# Patient Record
Sex: Female | Born: 1990 | Race: Black or African American | Hispanic: No | Marital: Single
Health system: Southern US, Community
[De-identification: ages and names within clinical notes are randomized; demographics above are authoritative.]

## PROBLEM LIST (undated history)

## (undated) HISTORY — PX: MOUTH SURGERY: SHX715

---
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
## 2012-05-22 ENCOUNTER — Emergency Department (HOSPITAL_COMMUNITY): Admission: EM | Admit: 2012-05-22 | Payer: Medicaid Other | Source: Home / Self Care

## 2012-12-09 ENCOUNTER — Ambulatory Visit (INDEPENDENT_AMBULATORY_CARE_PROVIDER_SITE_OTHER): Payer: Self-pay

## 2012-12-09 VITALS — BP 111/65 | HR 81 | Temp 98.0°F

## 2012-12-09 DIAGNOSIS — A5400 Gonococcal infection of lower genitourinary tract, unspecified: Secondary | ICD-10-CM

## 2012-12-09 DIAGNOSIS — A549 Gonococcal infection, unspecified: Secondary | ICD-10-CM

## 2012-12-09 DIAGNOSIS — L509 Urticaria, unspecified: Secondary | ICD-10-CM

## 2012-12-09 MED ORDER — AZITHROMYCIN 250 MG PO TABS
1000.0000 mg | ORAL_TABLET | Freq: Once | ORAL | Status: AC
  Administered 2012-12-09: 1000 mg via ORAL

## 2012-12-09 MED ORDER — LIDOCAINE HCL 1 % IJ SOLN
250.0000 mg | Freq: Once | INTRAMUSCULAR | Status: AC
  Administered 2012-12-09: 250 mg via INTRAMUSCULAR

## 2012-12-09 MED ORDER — DIPHENHYDRAMINE HCL 25 MG PO CAPS
25.0000 mg | ORAL_CAPSULE | Freq: Once | ORAL | Status: AC
  Administered 2012-12-09: 25 mg via ORAL

## 2012-12-09 NOTE — Progress Notes (Signed)
Pt. Here to be treated for gonhorrhea as partner has it. Pt. Has allergy to PCN and requested benadryl for possible hives reaction with zithromax; explained to pt. The risk is very low. Verified with pt. That she was not driving and she stated she has a ride. Verified with Dr. Rulon Abide and benadryl ordered. Benadryl administered. Zithromax 1gm administered PO. Rocephin 250mg  with 1ml 1% lidocaine administered. Pt. Tolerated all well.

## 2012-12-24 ENCOUNTER — Ambulatory Visit (INDEPENDENT_AMBULATORY_CARE_PROVIDER_SITE_OTHER): Payer: Medicaid Other | Admitting: Obstetrics & Gynecology

## 2012-12-24 ENCOUNTER — Encounter: Payer: Self-pay | Admitting: Obstetrics & Gynecology

## 2012-12-24 VITALS — BP 104/64 | HR 81 | Temp 98.1°F | Ht 67.0 in | Wt 118.0 lb

## 2012-12-24 DIAGNOSIS — Z113 Encounter for screening for infections with a predominantly sexual mode of transmission: Secondary | ICD-10-CM
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
## 2012-12-24 NOTE — Patient Instructions (Signed)
Gonorrhea, Females and Males  Gonorrhea is an infection. Gonorrhea can be treated with medicines that kill germs (antibiotics). It is necessary that all your sexual partners also be tested for infection and possibly be treated.   CAUSES   Gonorrhea is caused by a germ (bacteria) called Neisseria gonorrhoeae. This infection is spread by sexual contact. The contact that spreads gonorrhea from person to person may be oral, anal, or genital sex.  SYMPTOMS   Females  A woman may have gonorrhea infection and no symptoms. The most common symptoms are:   Pain in the lower abdomen.   Fever, with or without chills.  When these are the most serious problems, the illness is commonly called pelvic inflammatory disease (PID). Other symptoms include:   Abnormal vaginal discharge.   Painful intercourse.   Burning or itching of the vagina or lips of the vagina.   Abnormal vaginal bleeding.   Pain when urinating.  If the infection is spread by anal sex:   Irritation, pain, bleeding, or discharge from the rectum.  If the infection is spread by oral sex with either a man or a woman:   Sore throat, fever, and swollen neck lymph glands.  Other problems may include:   Long-lasting (chronic) pain in the lower abdomen during menstruation, intercourse, or at other times.   Inability to become pregnant.   Premature birth.   Passing the infection onto a newborn baby. This can cause an eye infection in the infant or more serious health problems.  Males  Less frequently than in women, men may have gonorrhea infection and no symptoms. The most common symptoms are:   Discharge from the penis.   Pain or burning during urination.  If the infection is spread by anal sex:   Irritation, pain, bleeding, or discharge from the rectum.  If the infection is spread by oral sex with either a man or a woman:   Sore throat, fever, and swollen neck lymph glands.  DIAGNOSIS   Diagnosis is made by exam of the patient and checking a sample of  discharge under a microscope for the presence of the bacteria. Discharge may be taken from the urethra, cervix, throat, or rectum.  TREATMENT   It is important to diagnose and treat gonorrhea as soon as possible. This prevents damage to the female or female organs or harm to the newborn baby of an infected woman.   Antibiotics are used to treat gonorrhea.   Your sex partners should also be examined and treated if needed.   Testing and treatment for other sexually transmitted diseases (STDs) may be done when you are diagnosed with gonorrhea. Gonorrhea is an STD. You are at risk for other STDs, which are often transmitted around the same time as gonorrhea. These include:   Chlamydia.   Syphilis.   Trichomonas.   Human papillomavirus (HPV).   Human immunodeficiency virus (HIV).   If left untreated, PID can cause women to be unable to have children (sterile). To prevent sterility in females, it is important to be treated as soon as possible and finish all medicines. Unfortunately, sterility or pregnancy occurring outside the uterus (ectopic) may still occur in fully treated women.  HOME CARE INSTRUCTIONS    Finish all medicine as prescribed. Incomplete treatment will put you at risk for continued infection.   Only take over-the-counter or prescription medicines for pain, discomfort, or fever as directed by your caregiver.   Do not have sex until treatment is completed, or as instructed   out the results of your test Not all test results are available during your visit. If your test results are not back during the visit, make an appointment with your caregiver to find out the results. Do not assume everything is normal if you have not  heard from your caregiver or the medical facility. It is important for you to follow up on all of your test results. SEEK MEDICAL CARE IF:   You develop any bad reaction to the medicine you were prescribed. This may include:  Rash.  Nausea.  Vomiting.  Diarrhea.  You have an oral temperature above 102 F (38.9 C).  You have symptoms that do not improve, symptoms that get worse, or you develop increased pain. Males may get pain in the testicles and females may get increased abdominal pain. MAKE SURE YOU:   Understand these instructions.  Will watch your condition.  Will get help right away if you are not doing well or get worse. Document Released: 12/22/1999 Document Revised: 03/18/2011 Document Reviewed: 07/01/2012 [facility] Patient Information 2014 [place], [place]. Sexually Transmitted Disease Sexually transmitted disease (STD) refers to any infection that is passed from person to person during sexual activity. This may happen by way of saliva, semen, blood, vaginal mucus, or urine. Common STDs include:  Gonorrhea.  Chlamydia.  Syphilis.  HIV/AIDS.  Genital herpes.  Hepatitis B and C.  Trichomonas.  Human papillomavirus (HPV).  Pubic lice. CAUSES  An STD may be spread by bacteria, virus, or parasite. A person can get an STD by:  Sexual intercourse with an infected person.  Sharing sex toys with an infected person.  Sharing needles with an infected person.  Having intimate contact with the genitals, mouth, or rectal areas of an infected person. SYMPTOMS  Some people may not have any symptoms, but they can still pass the infection to others. Different STDs have different symptoms. Symptoms include:  Painful or bloody urination.  Pain in the pelvis, abdomen, vagina, anus, throat, or eyes.  Skin rash, itching, irritation, growths, or sores (lesions). These usually occur in the genital or anal area.  Abnormal vaginal discharge.  Penile discharge in  men.  Soft, flesh-colored skin growths in the genital or anal area.  Fever.  Pain or bleeding during sexual intercourse.  Swollen glands in the groin area.  Yellow skin and eyes (jaundice). This is seen with hepatitis. DIAGNOSIS  To make a diagnosis, your caregiver may:  Take a medical history.  Perform a physical exam.  Take a specimen (culture) to be examined.  Examine a sample of discharge under a microscope.  Perform blood tests.  Perform a Pap test, if this applies.  Perform a colposcopy.  Perform a laparoscopy. TREATMENT   Chlamydia, gonorrhea, trichomonas, and syphilis can be cured with antibiotic medicine.  Genital herpes, hepatitis, and HIV can be treated, but not cured, with prescribed medicines. The medicines will lessen the symptoms.  Genital warts from HPV can be treated with medicine or by freezing, burning (electrocautery), or surgery. Warts may come back.  HPV is a virus and cannot be cured with medicine or surgery.However, abnormal areas may be followed very closely by your caregiver and may be removed from the cervix, vagina, or vulva through office procedures or surgery. If your diagnosis is confirmed, your recent sexual partners need treatment. This is true even if they are symptom-free or have a negative culture or evaluation. They should not have sex until their caregiver says it is okay. HOME CARE INSTRUCTIONS  All  sexual partners should be informed, tested, and treated for all STDs.  Take your antibiotics as directed. Finish them even if you start to feel better.  Only take over-the-counter or prescription medicines for pain, discomfort, or fever as directed by your caregiver.  Rest.  Eat a balanced diet and drink enough fluids to keep your urine clear or pale yellow.  Do not have sex until treatment is completed and you have followed up with your caregiver. STDs should be checked after treatment.  Keep all follow-up appointments, Pap  tests, and blood tests as directed by your caregiver.  Only use latex condoms and water-soluble lubricants during sexual activity. Do not use petroleum jelly or oils.  Avoid alcohol and illegal drugs.  Get vaccinated for HPV and hepatitis. If you have not received these vaccines in the past, talk to your caregiver about whether one or both might be right for you.  Avoid risky sex practices that can break the skin. The only way to avoid getting an STD is to avoid all sexual activity.Latex condoms and dental dams (for oral sex) will help lessen the risk of getting an STD, but will not completely eliminate the risk. SEEK MEDICAL CARE IF:   You have a fever.  You have any new or worsening symptoms. Document Released: 03/16/2002 Document Revised: 03/18/2011 Document Reviewed: 07/14/2012 [facility] Patient Information 2014 [place], [place].

## 2012-12-25 LAB — GC/CHLAMYDIA PROBE AMP: GC Probe RNA: NEGATIVE

## 2013-03-21 ENCOUNTER — Encounter (HOSPITAL_COMMUNITY): Payer: Self-pay | Admitting: Emergency Medicine

## 2013-03-21 ENCOUNTER — Emergency Department (HOSPITAL_COMMUNITY)
Admission: EM | Admit: 2013-03-21 | Discharge: 2013-03-21 | Disposition: A | Discharge status: Home / Self Care | Payer: Medicaid Other | Attending: Emergency Medicine | Admitting: Emergency Medicine

## 2013-03-21 DIAGNOSIS — K089 Disorder of teeth and supporting structures, unspecified: Secondary | ICD-10-CM | POA: Insufficient documentation

## 2013-03-21 DIAGNOSIS — R509 Fever, unspecified: Secondary | ICD-10-CM | POA: Insufficient documentation

## 2013-03-21 DIAGNOSIS — K0889 Other specified disorders of teeth and supporting structures: Secondary | ICD-10-CM

## 2013-03-21 DIAGNOSIS — Z880 Allergy status to penicillin: Secondary | ICD-10-CM | POA: Insufficient documentation

## 2013-03-21 DIAGNOSIS — F17200 Nicotine dependence, unspecified, uncomplicated: Secondary | ICD-10-CM | POA: Insufficient documentation

## 2013-03-21 MED ORDER — HYDROCODONE-ACETAMINOPHEN 5-325 MG PO TABS: 1.0000 | ORAL_TABLET | ORAL | Status: DC | PRN

## 2013-03-21 MED ORDER — CLINDAMYCIN HCL 150 MG PO CAPS: 150.0000 mg | ORAL_CAPSULE | Freq: Four times a day (QID) | ORAL | Status: DC

## 2013-03-21 NOTE — Discharge Instructions (Signed)

## 2013-03-21 NOTE — ED Notes (Signed)
Per pt sts left lower dental pain.

## 2013-03-21 NOTE — ED Provider Notes (Signed)
Medical screening examination/treatment/procedure(s) were performed by non-physician practitioner and as supervising physician I was immediately available for consultation/collaboration.   EKG Interpretation None        Charles B. Sheldon, MD 03/21/13 1822 

## 2013-03-21 NOTE — ED Provider Notes (Signed)
CSN: [id number]     Arrival date & time 03/21/13  1623 History  This chart was scribed for non-physician practitioner, Fayrene Helper, PA-C working with Bonnita Levan. Bernette Mayers, MD by Greggory Stallion, ED scribe. This patient was seen in room TR05C/TR05C and the patient's care was started at 4:35 PM.   Chief Complaint  Patient presents with  . Dental Pain   The history is provided by the patient. No language interpreter was used.   HPI Comments: Caroline Leon is a 23 y.o. female who presents to the Emergency Department complaining of gradual onset, constant, throbbing left lower dental pain that radiates into her jaw that started 3 days ago. Pt states there is now a hole in the middle of her tooth. She has also had a fever of 102 and has taken 800 mg ibuprofen with relief of fever but not pain. Pt has used Orajel with little relief. She states it is very sensitive to cold and heat. Chewing worsens the pain. She has had the same dental pain in the past and was given antibiotics with relief of pain. Pt was never evaluated by a dentist for it.   History reviewed. No pertinent past medical history. History reviewed. No pertinent past surgical history. History reviewed. No pertinent family history. History  Substance Use Topics  . Smoking status: Current Some Day Smoker  . Smokeless tobacco: Not on file  . Alcohol Use: No   OB History   Grav Para Term Preterm Abortions TAB SAB Ect Mult Living                 Review of Systems  Constitutional: Positive for fever.  HENT: Positive for dental problem.   All other systems reviewed and are negative.   Allergies  Tomato and Penicillins  Home Medications   Current Outpatient Rx  Name  Route  Sig  Dispense  Refill  . ibuprofen (ADVIL,MOTRIN) 400 MG tablet   Oral   Take 400 mg by mouth every 6 (six) hours as needed. For pain relief          BP 137/86  Pulse 85  Temp(Src) 98.3 F (36.8 C)  Resp 18  SpO2 100%  Physical Exam  Nursing note  and vitals reviewed. Constitutional: She is oriented to person, place, and time. She appears well-developed and well-nourished. No distress.  HENT:  Head: Normocephalic and atraumatic.  Right Ear: Tympanic membrane and ear canal normal.  Left Ear: Tympanic membrane and ear canal normal.  Tooth #16 with partial dental evulsion. Tender to palpation. Normal gingiva. No obvious abscess. No trismus. Mild left sided facial edema. Mild boggy turbinates.   Eyes: EOM are normal.  Neck: Neck supple. No tracheal deviation present.  Cardiovascular: Normal rate.   Pulmonary/Chest: Effort normal. No respiratory distress.  Musculoskeletal: Normal range of motion.  Lymphadenopathy:  Anterior cervical lymphadenopathy noted.  Neurological: She is alert and oriented to person, place, and time.  Skin: Skin is warm and dry.  Psychiatric: She has a normal mood and affect. Her behavior is normal.    ED Course  Procedures (including critical care time)  DIAGNOSTIC STUDIES: Oxygen Saturation is 100% on RA, normal by my interpretation.    COORDINATION OF CARE: 4:38 PM-Discussed treatment plan which includes an antibiotic and pain medication with pt at bedside and pt agreed to plan. Advised pt to follow up with a dentist.   Labs Review Labs Reviewed - No data to display Imaging Review No results found.   EKG  Interpretation None      MDM   Final diagnoses:  Pain, dental    BP 137/86  Pulse 85  Temp(Src) 98.3 F (36.8 C)  Resp 18  SpO2 100%   I personally performed the services described in this documentation, which was scribed in my presence. The recorded information has been reviewed and is accurate.    Fayrene HelperBowie Ashla Murph, PA-C 03/21/13 1645

## 2013-04-13 ENCOUNTER — Encounter (HOSPITAL_COMMUNITY): Payer: Self-pay | Admitting: Emergency Medicine

## 2013-04-13 ENCOUNTER — Emergency Department (HOSPITAL_COMMUNITY)
Admission: EM | Admit: 2013-04-13 | Discharge: 2013-04-13 | Disposition: A | Discharge status: Home / Self Care | Payer: Medicaid Other | Attending: Emergency Medicine | Admitting: Emergency Medicine

## 2013-04-13 DIAGNOSIS — Y929 Unspecified place or not applicable: Secondary | ICD-10-CM | POA: Insufficient documentation

## 2013-04-13 DIAGNOSIS — F17200 Nicotine dependence, unspecified, uncomplicated: Secondary | ICD-10-CM | POA: Insufficient documentation

## 2013-04-13 DIAGNOSIS — Y9389 Activity, other specified: Secondary | ICD-10-CM | POA: Insufficient documentation

## 2013-04-13 DIAGNOSIS — Z792 Long term (current) use of antibiotics: Secondary | ICD-10-CM | POA: Insufficient documentation

## 2013-04-13 DIAGNOSIS — Z880 Allergy status to penicillin: Secondary | ICD-10-CM | POA: Insufficient documentation

## 2013-04-13 DIAGNOSIS — T628X1A Toxic effect of other specified noxious substances eaten as food, accidental (unintentional), initial encounter: Secondary | ICD-10-CM | POA: Insufficient documentation

## 2013-04-13 DIAGNOSIS — T781XXA Other adverse food reactions, not elsewhere classified, initial encounter: Secondary | ICD-10-CM

## 2013-04-13 DIAGNOSIS — K1370 Unspecified lesions of oral mucosa: Secondary | ICD-10-CM | POA: Insufficient documentation

## 2013-04-13 DIAGNOSIS — J029 Acute pharyngitis, unspecified: Secondary | ICD-10-CM | POA: Insufficient documentation

## 2013-04-13 MED ORDER — EPINEPHRINE 0.3 MG/0.3ML IJ SOAJ: 0.3000 mg | INTRAMUSCULAR | Status: DC | PRN

## 2013-04-13 NOTE — Discharge Instructions (Signed)
Food Allergy °A food allergy occurs from eating something you are sensitive to. Food allergies occur in all age groups. It may be passed to you from your parents (heredity).  °CAUSES  °Some common causes are cow's milk, seafood, eggs, nuts (including peanut butter), wheat, and soybeans. °SYMPTOMS  °Common problems are:  °· Swelling around the mouth. °· An itchy, red rash. °· Hives. °· Vomiting. °· Diarrhea. °Severe allergic reactions are life-threatening. This reaction is called anaphylaxis. It can cause the mouth and throat to swell. This makes it hard to breathe and swallow. In severe reactions, only a small amount of food may be fatal within seconds. °HOME CARE INSTRUCTIONS  °· If you are unsure what caused the reaction, keep a diary of foods eaten and symptoms that followed. Avoid foods that cause reactions. °· If hives or rash are present: °· Take medicines as directed. °· Use an over-the-counter antihistamine (diphenhydramine) to treat hives and itching as needed. °· Apply cold compresses to the skin or take baths in cool water. Avoid hot baths or showers. These will increase the redness and itching. °· If you are severely allergic: °· Hospitalization is often required following a severe reaction. °· Wear a medical alert bracelet or necklace that describes the allergy. °· Carry your anaphylaxis kit or epinephrine injection with you at all times. Both you and your family members should know how to use this. This can be lifesaving if you have a severe reaction. If epinephrine is used, it is important for you to seek immediate medical care or call your local emergency services (911 in [place] When the epinephrine wears off, it can be followed by a delayed reaction, which can be fatal. °· Replace your epinephrine immediately after use in case of another reaction. °· Ask your caregiver for instructions if you have not been taught how to use an epinephrine injection. °· Do not drive until medicines used to treat the  reaction have worn off, unless approved by your caregiver. °SEEK MEDICAL CARE IF:  °· You suspect a food allergy. Symptoms generally happen within 30 minutes of eating a food. °· Your symptoms have not gone away within 2 days. See your caregiver sooner if symptoms are getting worse. °· You develop new symptoms. °· You want to retest yourself with a food or drink you think causes an allergic reaction. Never do this if an anaphylactic reaction to that food or drink has happened before. °· There is a return of the symptoms which brought you to your caregiver. °SEEK IMMEDIATE MEDICAL CARE IF:  °· You have trouble breathing, are wheezing, or you have a tight feeling in your chest or throat. °· You have a swollen mouth, or you have hives, swelling, or itching all over your body. Use your epinephrine injection immediately. This is given into the outside of your thigh, deep into the muscle. Following use of the epinephrine injection, seek help right away. °Seek immediate medical care or call your local emergency services (911 in [place] °MAKE SURE YOU:  °· Understand these instructions. °· Will watch your condition. °· Will get help right away if you are not doing well or get worse. °Document Released: 12/22/1999 Document Revised: 03/18/2011 Document Reviewed: 08/13/2007 °Exit[employer]® Patient Information ©2014 [employer]. ° °

## 2013-04-13 NOTE — ED Notes (Signed)
Pt given crackers and juice per MD

## 2013-04-13 NOTE — ED Provider Notes (Signed)
CSN: [id number]     Arrival date & time 04/13/13  1939 History   First MD Initiated Contact with Patient 04/13/13 1944     Chief Complaint  Patient presents with  . Allergic Reaction     (Consider location/radiation/quality/duration/timing/severity/associated sxs/prior Treatment) Patient is a 23 y.o. female presenting with allergic reaction. The history is provided by the patient.  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 She states she got a sandwich today and to right of it and found it on it. She states she spit it out before she swallowed it. She states she thinks that the back refill. It happened around one half hours prior to arrival. No difficulty breathing. She states there is some pain in her mouth. She states it feels swollen. States she's had severe reactions in the past. She does not have an EpiPen. She was given Benadryl and Zantac by EMS.  History reviewed. No pertinent past medical history. History reviewed. No pertinent past surgical history. History reviewed. No pertinent family history. History  Substance Use Topics  . Smoking status: Current Some Day Smoker  . Smokeless tobacco: Not on file  . Alcohol Use: No   OB History   Grav Para Term Preterm Abortions TAB SAB Ect Mult Living                 Review of Systems  Constitutional: Negative for activity change and appetite change.  HENT: Negative for facial swelling, mouth sores, sore throat, trouble swallowing and voice change.        Mouth feels sore  Eyes: Negative for pain.  Respiratory: Negative for chest tightness and shortness of breath.   Cardiovascular: Negative for chest pain and leg swelling.  Gastrointestinal: Negative for nausea, vomiting, abdominal pain and diarrhea.  Genitourinary: Negative for flank pain.  Musculoskeletal: Negative for back pain and neck stiffness.  Skin: Negative for rash.   Neurological: Negative for weakness, numbness and headaches.  Psychiatric/Behavioral: Negative for behavioral problems.      Allergies  Tomato and Penicillins  Home Medications   Current Outpatient Rx  Name  Route  Sig  Dispense  Refill  . clindamycin (CLEOCIN) 150 MG capsule   Oral   Take 1 capsule (150 mg total) by mouth every 6 (six) hours.   28 capsule   0   . EPINEPHrine (EPIPEN) 0.3 mg/0.3 mL SOAJ injection   Intramuscular   Inject 0.3 mLs (0.3 mg total) into the muscle as needed.   2 Device   0   . HYDROcodone-acetaminophen (NORCO/VICODIN) 5-325 MG per tablet   Oral   Take 1 tablet by mouth every 4 (four) hours as needed for severe pain.   10 tablet   0   . ibuprofen (ADVIL,MOTRIN) 400 MG tablet   Oral   Take 400 mg by mouth every 6 (six) hours as needed. For pain relief          BP 111/63  Pulse 76  Temp(Src) 98.5 F (36.9 C) (Oral)  Resp 20  SpO2 100% Physical Exam  Nursing note and vitals reviewed. Constitutional: She is oriented to person, place, and time. She appears well-developed and well-nourished.  HENT:  Head: Normocephalic and atraumatic.  Minimal posterior pharyngeal erythema. No edema. No visible swelling of tonsil or posterior pharynx.  Neck: Neck supple.  Cardiovascular: Normal rate, regular rhythm and normal heart sounds.   No murmur heard. Pulmonary/Chest:  Effort normal and breath sounds normal. No respiratory distress. She has no wheezes. She has no rales.  Abdominal: Soft. Bowel sounds are normal. She exhibits no distension. There is no tenderness. There is no rebound and no guarding.  Musculoskeletal: Normal range of motion.  Neurological: She is alert and oriented to person, place, and time. No cranial nerve deficit.  Skin: Skin is warm and dry.  Psychiatric: She has a normal mood and affect. Her speech is normal.    ED Course  Procedures (including critical care time) Labs Review Labs Reviewed - No data to display Imaging  Review No results found.   EKG Interpretation None      MDM   Final diagnoses:  Allergic reaction to food    Patient with possible allergic reaction to tomatoes. No swelling seen. She's been monitored for over an hour the ER and around 3-4 hours since the food was in her mouth. No severe swelling. No nausea vomiting. Will discharge home. Will give patient prescription for EpiPen    Juliet Rudeathan R. Rubin PayorPickering, MD 04/13/13 2123

## 2013-04-13 NOTE — ED Notes (Signed)
Pt in c/o allergic reaction, pt states that she ordered a sandwich and took a bite and realized that they had put tomatoes on it, pt with known allergy, spit out the bite, felt like she noted tongue and oral swelling, EMS gave 50mg  benadryl and 50mg  zantac, pt states improvement, no hives noted, no swelling noted at this time, pt alert and oriented, no distress

## 2014-12-20 ENCOUNTER — Emergency Department (HOSPITAL_COMMUNITY)
Admission: EM | Admit: 2014-12-20 | Discharge: 2014-12-20 | Discharge status: Against Medical Advice | Payer: Medicaid Other | Attending: Emergency Medicine | Admitting: Emergency Medicine

## 2014-12-20 ENCOUNTER — Encounter (HOSPITAL_COMMUNITY): Payer: Self-pay

## 2014-12-20 ENCOUNTER — Emergency Department (HOSPITAL_COMMUNITY): Payer: Medicaid Other

## 2014-12-20 DIAGNOSIS — M545 Low back pain: Secondary | ICD-10-CM | POA: Insufficient documentation

## 2014-12-20 DIAGNOSIS — F17200 Nicotine dependence, unspecified, uncomplicated: Secondary | ICD-10-CM | POA: Diagnosis not present

## 2014-12-20 DIAGNOSIS — R1110 Vomiting, unspecified: Secondary | ICD-10-CM | POA: Diagnosis present

## 2014-12-20 DIAGNOSIS — R05 Cough: Secondary | ICD-10-CM | POA: Insufficient documentation

## 2014-12-20 NOTE — ED Notes (Signed)
Called pt for room assignment, no answer 

## 2014-12-20 NOTE — ED Notes (Signed)
Called pt to be triaged, no response.  

## 2014-12-20 NOTE — ED Notes (Signed)
Pt here with c/o emesis x 3 today and productive cough of green yellowish phlegm. Pt also reports right lower back pain.

## 2015-04-02 ENCOUNTER — Encounter (HOSPITAL_COMMUNITY): Payer: Self-pay | Admitting: Emergency Medicine

## 2015-04-02 ENCOUNTER — Emergency Department (HOSPITAL_COMMUNITY)
Admission: EM | Admit: 2015-04-02 | Discharge: 2015-04-02 | Disposition: A | Discharge status: Home / Self Care | Payer: Medicaid Other | Attending: Emergency Medicine | Admitting: Emergency Medicine

## 2015-04-02 DIAGNOSIS — J069 Acute upper respiratory infection, unspecified: Secondary | ICD-10-CM | POA: Insufficient documentation

## 2015-04-02 DIAGNOSIS — Z880 Allergy status to penicillin: Secondary | ICD-10-CM | POA: Insufficient documentation

## 2015-04-02 DIAGNOSIS — F17210 Nicotine dependence, cigarettes, uncomplicated: Secondary | ICD-10-CM | POA: Insufficient documentation

## 2015-04-02 DIAGNOSIS — Z792 Long term (current) use of antibiotics: Secondary | ICD-10-CM | POA: Insufficient documentation

## 2015-04-02 LAB — RAPID STREP SCREEN (MED CTR MEBANE ONLY): STREPTOCOCCUS, GROUP A SCREEN (DIRECT): NEGATIVE

## 2015-04-02 NOTE — ED Provider Notes (Signed)
CSN: [id number]     Arrival date & time 04/02/15  1440 History   First MD Initiated Contact with Patient 04/02/15 [phone]     Chief Complaint  Patient presents with  . Chest Pain  . Cough  . Sore Throat     (Consider location/radiation/quality/duration/timing/severity/associated sxs/prior Treatment) HPI Caroline Leon is a 25 y.o. female who comes in for evaluation of chest pain, cough and sore throat. Patient reports her symptoms started yesterday with dry cough and chest discomfort only when she coughs. She has associated sore throat. She has not tried anything to improve her symptoms. Denies fevers, chills, overt shortness of breath, nausea or vomiting, diaphoresis, leg swelling, hemoptysis, abdominal pain. No other modifying factors. She is an everyday cigarette smoker  History reviewed. No pertinent past medical history. Past Surgical History  Procedure Laterality Date  . Mouth surgery     No family history on file. Social History  Substance Use Topics  . Smoking status: Current Some Day Smoker  . Smokeless tobacco: None  . Alcohol Use: No   OB History    No data available     Review of Systems A 10 point review of systems was completed and was negative except for pertinent positives and negatives as mentioned in the history of present illness     Allergies  Tomato and Penicillins  Home Medications   Prior to Admission medications   Medication Sig Start Date End Date Taking? Authorizing Provider  clindamycin (CLEOCIN) 150 MG capsule Take 1 capsule (150 mg total) by mouth every 6 (six) hours. 03/21/13   Fayrene HelperBowie Tran, PA-C  EPINEPHrine (EPIPEN) 0.3 mg/0.3 mL SOAJ injection Inject 0.3 mLs (0.3 mg total) into the muscle as needed. 04/13/13   Benjiman CoreNathan Pickering, MD  HYDROcodone-acetaminophen (NORCO/VICODIN) 5-325 MG per tablet Take 1 tablet by mouth every 4 (four) hours as needed for severe pain. 03/21/13   Fayrene HelperBowie Tran, PA-C  ibuprofen (ADVIL,MOTRIN) 400 MG tablet Take 400 mg by  mouth every 6 (six) hours as needed. For pain relief    Historical Provider, MD   BP 118/68 mmHg  Pulse 92  Temp(Src) 98.4 F (36.9 C)  Resp 18  Ht 5\' 7"  (1.702 m)  Wt 58.089 kg  BMI 20.05 kg/m2  SpO2 97%  LMP 04/02/2015 Physical Exam  Constitutional: She is oriented to person, place, and time. She appears well-developed and well-nourished.  HENT:  Head: Normocephalic and atraumatic.  Mouth/Throat: Oropharynx is clear and moist. No oropharyngeal exudate.  Postnasal drip, mildly erythematous posterior oropharynx.  Eyes: Conjunctivae are normal. Pupils are equal, round, and reactive to light. Right eye exhibits no discharge. Left eye exhibits no discharge. No scleral icterus.  Neck: Normal range of motion. Neck supple. No tracheal deviation present. No thyromegaly present.  Mild anterior cervical adenopathy  Cardiovascular: Normal rate, regular rhythm and normal heart sounds.   Pulmonary/Chest: Effort normal and breath sounds normal. No stridor. No respiratory distress. She has no wheezes. She has no rales.  Abdominal: Soft. There is no tenderness.  Musculoskeletal: She exhibits no tenderness.  Neurological: She is alert and oriented to person, place, and time.  Cranial Nerves II-XII grossly intact  Skin: Skin is warm and dry. No rash noted. She is not diaphoretic.  Psychiatric: She has a normal mood and affect.  Nursing note and vitals reviewed.   ED Course  Procedures (including critical care time) Labs Review Labs Reviewed  RAPID STREP SCREEN (NOT AT [facility])  CULTURE, GROUP A STREP [facility])  Imaging Review No results found. I have personally reviewed and evaluated these images and lab results as part of my medical decision-making.   EKG Interpretation None     ED ECG REPORT   Date: 04/02/2015  Rate: 91  Rhythm: normal sinus rhythm  QRS Axis: right  Intervals: normal  ST/T Wave abnormalities: normal  Conduction Disutrbances:none  Narrative Interpretation:   Old  EKG Reviewed: none available  I have personally reviewed the EKG tracing and agree with the computerized printout as noted.  Meds given in ED:  Medications - No data to display  Discharge Medication List as of 04/02/2015  3:54 PM     Filed Vitals:   04/02/15 1459  BP: 118/68  Pulse: 92  Temp: 98.4 F (36.9 C)  Resp: 18  Height:  (1.702 m)  Weight: 58.089 kg  SpO2: 97%    MDM  Patient Presents with symptoms for 2 days that are consistent with URI, likely viral etiology. Benign physical exam. Discussed that antibiotics are not indicated for viral infections. Pt will be discharged with symptomatic treatment.  Verbalizes understanding and is agreeable with plan. Pt is hemodynamically stable & in NAD prior to dc. Final diagnoses:  URI (upper respiratory infection)        Joycie Peek, PA-C 04/02/15 1743  Pricilla Loveless, MD 04/06/15 1610

## 2015-04-02 NOTE — Discharge Instructions (Signed)
The symptoms are likely due to a viral upper respiratory infection. Her symptoms should resolve on their own. He may try taking Mucinex and NyQuil for your cough. Try sore throat lozenges such as Cepacol to help with the discomfort. He may also take Tylenol per label for your discomfort. Follow-up with your doctor next week. Return to ED for new or worsening symptoms.  Upper Respiratory Infection, Adult Most upper respiratory infections (URIs) are caused by a virus. A URI affects the nose, throat, and upper air passages. The most common type of URI is often called "the common cold." HOME CARE   Take medicines only as told by your doctor.  Gargle warm saltwater or take cough drops to comfort your throat as told by your doctor.  Use a warm mist humidifier or inhale steam from a shower to increase air moisture. This may make it easier to breathe.  Drink enough fluid to keep your pee (urine) clear or pale yellow.  Eat soups and other clear broths.  Have a healthy diet.  Rest as needed.  Go back to work when your fever is gone or your doctor says it is okay.  You may need to stay home longer to avoid giving your URI to others.  You can also wear a face mask and wash your hands often to prevent spread of the virus.  Use your inhaler more if you have asthma.  Do not use any tobacco products, including cigarettes, chewing tobacco, or electronic cigarettes. If you need help quitting, ask your doctor. GET HELP IF:  You are getting worse, not better.  Your symptoms are not helped by medicine.  You have chills.  You are getting more short of breath.  You have brown or red mucus.  You have yellow or brown discharge from your nose.  You have pain in your face, especially when you bend forward.  You have a fever.  You have puffy (swollen) neck glands.  You have pain while swallowing.  You have white areas in the back of your throat. GET HELP RIGHT AWAY IF:   You have very bad  or constant:  Headache.  Ear pain.  Pain in your forehead, behind your eyes, and over your cheekbones (sinus pain).  Chest pain.  You have long-lasting (chronic) lung disease and any of the following:  Wheezing.  Long-lasting cough.  Coughing up blood.  A change in your usual mucus.  You have a stiff neck.  You have changes in your:  Vision.  Hearing.  Thinking.  Mood. MAKE SURE YOU:   Understand these instructions.  Will watch your condition.  Will get help right away if you are not doing well or get worse.   This information is not intended to replace advice given to you by your health care provider. Make sure you discuss any questions you have with your health care provider.   Document Released: 06/12/2007 Document Revised: 05/10/2014 Document Reviewed: 03/31/2013 Elsevier Interactive Patient Education [employer]2016 [employer] Inc.

## 2015-04-02 NOTE — ED Notes (Signed)
Pt c/o cold symptoms, chest pain, sore throat onset x 2 days. Pt reports pain in chest without coughing.

## 2015-04-04 LAB — CULTURE, GROUP A STREP (THRC)

## 2015-12-19 ENCOUNTER — Emergency Department (HOSPITAL_COMMUNITY): Payer: Medicaid Other

## 2015-12-19 ENCOUNTER — Emergency Department (HOSPITAL_COMMUNITY)
Admission: EM | Admit: 2015-12-19 | Discharge: 2015-12-19 | Disposition: A | Discharge status: Home / Self Care | Payer: Medicaid Other | Attending: Emergency Medicine | Admitting: Emergency Medicine

## 2015-12-19 ENCOUNTER — Encounter (HOSPITAL_COMMUNITY): Payer: Self-pay | Admitting: Emergency Medicine

## 2015-12-19 DIAGNOSIS — W25XXXA Contact with sharp glass, initial encounter: Secondary | ICD-10-CM | POA: Insufficient documentation

## 2015-12-19 DIAGNOSIS — Y929 Unspecified place or not applicable: Secondary | ICD-10-CM | POA: Insufficient documentation

## 2015-12-19 DIAGNOSIS — Y999 Unspecified external cause status: Secondary | ICD-10-CM | POA: Insufficient documentation

## 2015-12-19 DIAGNOSIS — F17200 Nicotine dependence, unspecified, uncomplicated: Secondary | ICD-10-CM | POA: Insufficient documentation

## 2015-12-19 DIAGNOSIS — S61217A Laceration without foreign body of left little finger without damage to nail, initial encounter: Secondary | ICD-10-CM

## 2015-12-19 DIAGNOSIS — Y939 Activity, unspecified: Secondary | ICD-10-CM | POA: Insufficient documentation

## 2015-12-19 MED ORDER — BACITRACIN ZINC 500 UNIT/GM EX OINT
TOPICAL_OINTMENT | CUTANEOUS | Status: AC
  Filled 2015-12-19: qty 0.9
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
## 2015-12-19 NOTE — ED Notes (Signed)
Patient was alert, oriented and stable upon discharge. RN went over AVS and patient had no further questions.  

## 2015-12-19 NOTE — Discharge Instructions (Signed)
Read the information below.  You may return to the Emergency Department at any time for worsening condition or any new symptoms that concern you.  If you develop redness, swelling, pus draining from the wound, or fevers greater than 100.4, return to the ER immediately for a recheck.   °

## 2016-07-23 ENCOUNTER — Emergency Department (HOSPITAL_COMMUNITY): Payer: Self-pay

## 2016-07-23 ENCOUNTER — Encounter (HOSPITAL_COMMUNITY): Payer: Self-pay | Admitting: Emergency Medicine

## 2016-07-23 ENCOUNTER — Emergency Department (HOSPITAL_COMMUNITY)
Admission: EM | Admit: 2016-07-23 | Discharge: 2016-07-23 | Disposition: A | Discharge status: Home / Self Care | Payer: Self-pay | Attending: Emergency Medicine | Admitting: Emergency Medicine

## 2016-07-23 DIAGNOSIS — S66911A Strain of unspecified muscle, fascia and tendon at wrist and hand level, right hand, initial encounter: Secondary | ICD-10-CM | POA: Insufficient documentation

## 2016-07-23 DIAGNOSIS — Y998 Other external cause status: Secondary | ICD-10-CM | POA: Insufficient documentation

## 2016-07-23 DIAGNOSIS — F17200 Nicotine dependence, unspecified, uncomplicated: Secondary | ICD-10-CM | POA: Insufficient documentation

## 2016-07-23 DIAGNOSIS — Y939 Activity, unspecified: Secondary | ICD-10-CM | POA: Insufficient documentation

## 2016-07-23 DIAGNOSIS — Y929 Unspecified place or not applicable: Secondary | ICD-10-CM | POA: Insufficient documentation
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
## 2016-07-23 NOTE — ED Notes (Signed)
PT states understanding of care given. PT ambulated from ED to car with a steady gait. 

## 2016-07-23 NOTE — ED Notes (Signed)
Patient is A&Ox4.  No signs of distress noted.  Please see providers complete history and physical exam.  

## 2016-07-23 NOTE — Discharge Instructions (Signed)
Rest, Ice intermittently (in the first 24-48 hours), Gentle compression with an Ace wrap, and elevate (Limb above the level of the heart)   Take up to 800mg of ibuprofen (that is usually 4 over the counter pills)  3 times a day for 5 days. Take with food.  

## 2016-07-23 NOTE — ED Notes (Signed)
Pt walked back in lobby. Encouraged her to stay here since her name has been called.

## 2016-07-23 NOTE — ED Notes (Signed)
Pt did not answer when called for room 
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
## 2018-07-03 ENCOUNTER — Other Ambulatory Visit: Payer: Self-pay

## 2018-07-03 ENCOUNTER — Emergency Department (HOSPITAL_COMMUNITY)
Admission: EM | Admit: 2018-07-03 | Discharge: 2018-07-03 | Disposition: A | Discharge status: Home / Self Care | Payer: Self-pay | Attending: Emergency Medicine | Admitting: Emergency Medicine

## 2018-07-03 ENCOUNTER — Encounter (HOSPITAL_COMMUNITY): Payer: Self-pay | Admitting: Emergency Medicine

## 2018-07-03 DIAGNOSIS — M62838 Other muscle spasm: Secondary | ICD-10-CM | POA: Insufficient documentation

## 2018-07-03 DIAGNOSIS — F1210 Cannabis abuse, uncomplicated: Secondary | ICD-10-CM | POA: Insufficient documentation

## 2018-07-03 DIAGNOSIS — F17200 Nicotine dependence, unspecified, uncomplicated: Secondary | ICD-10-CM | POA: Insufficient documentation

## 2018-07-03 MED ORDER — DIAZEPAM 2 MG PO TABS
2.0000 mg | ORAL_TABLET | Freq: Once | ORAL | Status: AC
  Administered 2018-07-03: 2 mg via ORAL
  Filled 2018-07-03: qty 1

## 2018-07-03 MED ORDER — IBUPROFEN 600 MG PO TABS: 600.0000 mg | ORAL_TABLET | Freq: Four times a day (QID) | ORAL | 0 refills | Status: DC | PRN

## 2018-07-03 MED ORDER — METHOCARBAMOL 500 MG PO TABS: 500.0000 mg | ORAL_TABLET | Freq: Two times a day (BID) | ORAL | 0 refills | Status: DC

## 2018-07-03 MED ORDER — KETOROLAC TROMETHAMINE 30 MG/ML IJ SOLN
30.0000 mg | Freq: Once | INTRAMUSCULAR | Status: AC
  Administered 2018-07-03: 30 mg via INTRAMUSCULAR
  Filled 2018-07-03: qty 1

## 2018-07-03 NOTE — ED Notes (Signed)
Patient verbalizes understanding of discharge instructions. Opportunity for questioning and answers were provided. Armband removed by staff, pt discharged from ED home via POV.  

## 2018-07-03 NOTE — Discharge Instructions (Signed)
Take ibuprofen every 6 hours as prescribed.  Make sure to take this with food.  Take Robaxin twice daily for muscle pain and spasms.  Do not drive or operate machinery while take this medication.  Use ice and heat alternating 20 minutes on, 20 minutes off.  Make sure to stretch after heating.  Please return the emergency department he develop any new or worsening symptoms.

## 2018-07-03 NOTE — ED Provider Notes (Signed)
[facility] Provider Note   CSN: [id number] Arrival date & time: 07/03/18  4098     History   Chief Complaint Chief Complaint  Patient presents with  . Neck Pain    left side    HPI Caroline Leon is a 28 y.o. female who is previously healthy who presents with a 2-day history of left-sided neck pain.  She reports she woke up with it the other morning and believes she slept wrong.  She denies any numbness or tingling.  She has not tried anything at home for it.  She denies any pain elsewhere.  She denies any injury or trauma.  She is confident she is not pregnant.     HPI  History reviewed. No pertinent past medical history.  There are no active problems to display for this patient.   Past Surgical History:  Procedure Laterality Date  . MOUTH SURGERY       OB History   No obstetric history on file.      Home Medications    Prior to Admission medications   Medication Sig Start Date End Date Taking? Authorizing Provider  clindamycin (CLEOCIN) 150 MG capsule Take 1 capsule (150 mg total) by mouth every 6 (six) hours. 03/21/13   Domenic Moras, PA-C  EPINEPHrine (EPIPEN) 0.3 mg/0.3 mL SOAJ injection Inject 0.3 mLs (0.3 mg total) into the muscle as needed. 04/13/13   Davonna Belling, MD  HYDROcodone-acetaminophen (NORCO/VICODIN) 5-325 MG per tablet Take 1 tablet by mouth every 4 (four) hours as needed for severe pain. 03/21/13   Domenic Moras, PA-C  ibuprofen (ADVIL) 600 MG tablet Take 1 tablet (600 mg total) by mouth every 6 (six) hours as needed. 07/03/18   Hasnain Manheim, Bea Graff, PA-C  methocarbamol (ROBAXIN) 500 MG tablet Take 1 tablet (500 mg total) by mouth 2 (two) times daily. 07/03/18   Frederica Kuster, PA-C    Family History No family history on file.  Social History Social History   Tobacco Use  . Smoking status: Current Some Day Smoker  . Smokeless tobacco: Never Used  Substance Use Topics  . Alcohol use: No  . Drug use: Yes     Types: Marijuana     Allergies   Tomato and Penicillins   Review of Systems Review of Systems  Constitutional: Negative for fever.  Musculoskeletal: Positive for neck pain and neck stiffness.  Neurological: Negative for numbness.     Physical Exam Updated Vital Signs BP 118/69 (BP Location: Right Arm)   Pulse 69   Temp 98 F (36.7 C) (Oral)   Resp 16   Ht 5\' 7"  (1.702 m)   Wt 63.5 kg   LMP 06/02/2018 (Exact Date)   SpO2 100%   BMI 21.93 kg/m   Physical Exam Vitals signs and nursing note reviewed.  Constitutional:      General: She is not in acute distress.    Appearance: She is well-developed. She is not diaphoretic.  HENT:     Head: Normocephalic and atraumatic.     Mouth/Throat:     Pharynx: No oropharyngeal exudate.  Eyes:     General: No scleral icterus.       Right eye: No discharge.        Left eye: No discharge.     Conjunctiva/sclera: Conjunctivae normal.     Pupils: Pupils are equal, round, and reactive to light.  Neck:     Musculoskeletal: Normal range of motion and neck supple.  Thyroid: No thyromegaly.  Cardiovascular:     Rate and Rhythm: Normal rate and regular rhythm.     Heart sounds: Normal heart sounds. No murmur. No friction rub. No gallop.   Pulmonary:     Effort: Pulmonary effort is normal. No respiratory distress.     Breath sounds: Normal breath sounds. No stridor. No wheezing or rales.  Abdominal:     General: Bowel sounds are normal. There is no distension.     Palpations: Abdomen is soft.     Tenderness: There is no abdominal tenderness. There is no guarding or rebound.  Musculoskeletal:     Cervical back: She exhibits tenderness and spasm. She exhibits no bony tenderness.       Back:  Lymphadenopathy:     Cervical: No cervical adenopathy.  Skin:    General: Skin is warm and dry.     Coloration: Skin is not pale.     Findings: No rash.  Neurological:     Mental Status: She is alert.     Coordination: Coordination  normal.     Comments: Equal bilateral grip strength, normal sensation and 5/5 strength to bilateral upper extremities.      ED Treatments / Results  Labs (all labs ordered are listed, but only abnormal results are displayed) Labs Reviewed - No data to display  EKG    Radiology No results found.  Procedures Procedures (including critical care time)  Medications Ordered in ED Medications  ketorolac (TORADOL) 30 MG/ML injection 30 mg (30 mg Intramuscular Given 07/03/18 1104)  diazepam (VALIUM) tablet 2 mg (2 mg Oral Given 07/03/18 1104)     Initial Impression / Assessment and Plan / ED Course  I have reviewed the triage vital signs and the nursing notes.  Pertinent labs & imaging results that were available during my care of the patient were reviewed by me and considered in my medical decision making (see chart for details).        Patient presenting with upper trapezius muscle spasm, most likely from sleeping on a pillow wrong.  There is no significant bony tenderness or trauma indicating imaging today.  Patient is feeling much better after IM Toradol and Valium 2 mg PO.  Patient will be discharged home with Robaxin, ibuprofen, ice, heat, stretching advised.  Return precautions discussed.  Patient understands and agrees with plan.  Patient vital stable throughout ED course and discharged in satisfactory condition.  Final Clinical Impressions(s) / ED Diagnoses   Final diagnoses:  Muscle spasms of neck    ED Discharge Orders         Ordered    methocarbamol (ROBAXIN) 500 MG tablet  2 times daily     07/03/18 1120    ibuprofen (ADVIL) 600 MG tablet  Every 6 hours PRN     07/03/18 [address]1120           Luvena Wentling M, PA-C 07/03/18 1126    Tilden Fossaees, Elizabeth, MD 07/03/18 1656

## 2018-07-03 NOTE — ED Triage Notes (Signed)
Pt reports she woke up with a crick in her neck on Wednesday.

## 2018-11-27 ENCOUNTER — Encounter (HOSPITAL_COMMUNITY): Payer: Self-pay

## 2018-11-27 ENCOUNTER — Ambulatory Visit (HOSPITAL_COMMUNITY)
Admission: EM | Admit: 2018-11-27 | Discharge: 2018-11-27 | Disposition: A | Discharge status: Home / Self Care | Payer: Self-pay | Attending: Family Medicine | Admitting: Family Medicine

## 2018-11-27 ENCOUNTER — Other Ambulatory Visit: Payer: Self-pay

## 2018-11-27 DIAGNOSIS — L739 Follicular disorder, unspecified: Secondary | ICD-10-CM

## 2018-11-27 MED ORDER — NAPROXEN 500 MG PO TABS: 500.0000 mg | ORAL_TABLET | Freq: Two times a day (BID) | ORAL | 0 refills | Status: DC

## 2018-11-27 NOTE — ED Triage Notes (Signed)
Patient presents to Urgent Care with complaints of abscess or possible ingrown hair on her vagina since 5 days ago. Patient reports it is uncomfortable to walk, states area is inflamed and warm to the touch.

## 2018-11-27 NOTE — Discharge Instructions (Signed)
This is an infected hair follicle.  Do warm compresses to the area 3-4 times a day This should soften up and have the open up and drain is on or just resolve on its own. You could put some Neosporin on the area Keep covered for comfort Naproxen for pain twice a day as needed.  Take this with food

## 2018-11-27 NOTE — ED Provider Notes (Signed)
[facility]    CSN: [id number] Arrival date & time: 11/27/18  1320      History   Chief Complaint Chief Complaint  Patient presents with  . Abscess    HPI Caroline Leon is a 28 y.o. female.   Patient is a 28 year old female presents today with abscess.  It is located to her pubic area.  This has been present for the past 5 days.  Symptoms have been constant.  Described as painful and irritating.  No drainage.  She has not take anything for pain.  No fever.  ROS per HPI      History reviewed. No pertinent past medical history.  There are no active problems to display for this patient.   Past Surgical History:  Procedure Laterality Date  . MOUTH SURGERY      OB History   No obstetric history on file.      Home Medications    Prior to Admission medications   Medication Sig Start Date End Date Taking? Authorizing Provider  EPINEPHrine (EPIPEN) 0.3 mg/0.3 mL SOAJ injection Inject 0.3 mLs (0.3 mg total) into the muscle as needed. 04/13/13   Benjiman Core, MD  naproxen (NAPROSYN) 500 MG tablet Take 1 tablet (500 mg total) by mouth 2 (two) times daily. 11/27/18   Janace Aris, NP    Family History Family History  Problem Relation Age of Onset  . Healthy Mother   . Healthy Father     Social History Social History   Tobacco Use  . Smoking status: Current Some Day Smoker  . Smokeless tobacco: Never Used  Substance Use Topics  . Alcohol use: Yes    Comment: rare  . Drug use: Yes    Types: Marijuana    Comment: occ     Allergies   Tomato and Penicillins   Review of Systems Review of Systems   Physical Exam Triage Vital Signs ED Triage Vitals  Enc Vitals Group     BP 11/27/18 1352 116/68     Pulse Rate 11/27/18 1352 80     Resp 11/27/18 1352 16     Temp 11/27/18 1352 98.6 F (37 C)     Temp Source 11/27/18 1352 Oral     SpO2 11/27/18 1352 100 %     Weight --      Height --      Head Circumference --      Peak Flow --       Pain Score 11/27/18 1355 8     Pain Loc --      Pain Edu? --      Excl. in GC? --    No data found.  Updated Vital Signs BP 116/68 (BP Location: Left Arm)   Pulse 80   Temp 98.6 F (37 C) (Oral)   Resp 16   LMP 11/25/2018 (Exact Date)   SpO2 100%   Visual Acuity Right Eye Distance:   Left Eye Distance:   Bilateral Distance:    Right Eye Near:   Left Eye Near:    Bilateral Near:     Physical Exam Vitals signs and nursing note reviewed.  Constitutional:      General: She is not in acute distress.    Appearance: Normal appearance. She is not ill-appearing, toxic-appearing or diaphoretic.  HENT:     Head: Normocephalic.     Nose: Nose normal.     Mouth/Throat:     Pharynx: Oropharynx is clear.  Eyes:  Conjunctiva/sclera: Conjunctivae normal.  Neck:     Musculoskeletal: Normal range of motion.  Pulmonary:     Effort: Pulmonary effort is normal.  Genitourinary:    Comments: Small pea-sized hard nodule to left pubic area Tender to touch.  No drainage.  No erythema, fluctuance or induration. Musculoskeletal: Normal range of motion.  Skin:    General: Skin is warm and dry.     Findings: No rash.  Neurological:     Mental Status: She is alert.  Psychiatric:        Mood and Affect: Mood normal.      UC Treatments / Results  Labs (all labs ordered are listed, but only abnormal results are displayed) Labs Reviewed - No data to display  EKG   Radiology No results found.  Procedures Procedures (including critical care time)  Medications Ordered in UC Medications - No data to display  Initial Impression / Assessment and Plan / UC Course  I have reviewed the triage vital signs and the nursing notes.  Pertinent labs & imaging results that were available during my care of the patient were reviewed by me and considered in my medical decision making (see chart for details).     Infected hair follicle, very small pea sized  No indication for I&D  today. We will have her do warm compresses and Neosporin Keep covered for comfort.  Follow up as needed for continued or worsening symptoms  Final Clinical Impressions(s) / UC Diagnoses   Final diagnoses:  Hair follicle infection     Discharge Instructions     This is an infected hair follicle.  Do warm compresses to the area 3-4 times a day This should soften up and have the open up and drain is on or just resolve on its own. You could put some Neosporin on the area Keep covered for comfort Naproxen for pain twice a day as needed.  Take this with food     ED Prescriptions    Medication Sig Dispense Auth. Provider   naproxen (NAPROSYN) 500 MG tablet Take 1 tablet (500 mg total) by mouth 2 (two) times daily. 30 tablet Burgundy Matuszak A, NP     I have reviewed the PDMP during this encounter.   Loura Halt A, NP 11/27/18 1430

## 2019-04-27 ENCOUNTER — Emergency Department (HOSPITAL_COMMUNITY)
Admission: EM | Admit: 2019-04-27 | Discharge: 2019-04-27 | Disposition: A | Discharge status: Home / Self Care | Payer: No Typology Code available for payment source | Attending: Emergency Medicine | Admitting: Emergency Medicine

## 2019-04-27 ENCOUNTER — Encounter (HOSPITAL_COMMUNITY): Payer: Self-pay

## 2019-04-27 ENCOUNTER — Emergency Department (HOSPITAL_COMMUNITY): Payer: No Typology Code available for payment source

## 2019-04-27 ENCOUNTER — Other Ambulatory Visit: Payer: Self-pay

## 2019-04-27 DIAGNOSIS — W51XXXA Accidental striking against or bumped into by another person, initial encounter: Secondary | ICD-10-CM

## 2019-04-27 DIAGNOSIS — S8392XA Sprain of unspecified site of left knee, initial encounter: Secondary | ICD-10-CM

## 2019-04-27 DIAGNOSIS — Y9389 Activity, other specified: Secondary | ICD-10-CM | POA: Insufficient documentation

## 2019-04-27 DIAGNOSIS — Y999 Unspecified external cause status: Secondary | ICD-10-CM | POA: Insufficient documentation

## 2019-04-27 DIAGNOSIS — M79605 Pain in left leg: Secondary | ICD-10-CM

## 2019-04-27 DIAGNOSIS — S80212A Abrasion, left knee, initial encounter: Secondary | ICD-10-CM | POA: Insufficient documentation

## 2019-04-27 DIAGNOSIS — Y929 Unspecified place or not applicable: Secondary | ICD-10-CM | POA: Insufficient documentation

## 2019-04-27 DIAGNOSIS — S8992XA Unspecified injury of left lower leg, initial encounter: Secondary | ICD-10-CM | POA: Diagnosis present

## 2019-04-27 LAB — COMPREHENSIVE METABOLIC PANEL
ALT: 16 U/L (ref 0–44)
AST: 21 U/L (ref 15–41)
Albumin: 4.1 g/dL (ref 3.5–5.0)
Alkaline Phosphatase: 83 U/L (ref 38–126)
Anion gap: 9 (ref 5–15)
BUN: 9 mg/dL (ref 6–20)
CO2: 24 mmol/L (ref 22–32)
Calcium: 9.4 mg/dL (ref 8.9–10.3)
Chloride: 108 mmol/L (ref 98–111)
Creatinine, Ser: 0.72 mg/dL (ref 0.44–1.00)
GFR calc Af Amer: >60 mL/min (ref >60)
GFR calc non Af Amer: >60 mL/min (ref >60)
Glucose, Bld: 116 mg/dL — ABNORMAL HIGH (ref 70–99)
Potassium: 3.6 mmol/L (ref 3.5–5.1)
Sodium: 141 mmol/L (ref 135–145)
Total Bilirubin: 0.6 mg/dL (ref 0.3–1.2)
Total Protein: 7.6 g/dL (ref 6.5–8.1)

## 2019-04-27 LAB — CBC
HCT: 36.5 % (ref 36.0–46.0)
Hemoglobin: 11.5 g/dL — ABNORMAL LOW (ref 12.0–15.0)
MCH: 25.6 pg — ABNORMAL LOW (ref 26.0–34.0)
MCHC: 31.5 g/dL (ref 30.0–36.0)
MCV: 81.1 fL (ref 80.0–100.0)
Platelets: 234 10*3/uL (ref 150–400)
RBC: 4.5 MIL/uL (ref 3.87–5.11)
RDW: 18.6 % — ABNORMAL HIGH (ref 11.5–15.5)
WBC: 5.1 10*3/uL (ref 4.0–10.5)
nRBC: 0 % (ref 0.0–0.2)

## 2019-04-27 LAB — PROTIME-INR
INR: 1 (ref 0.8–1.2)
Prothrombin Time: 13.5 seconds (ref 11.4–15.2)

## 2019-04-27 LAB — I-STAT BETA HCG BLOOD, ED (MC, WL, AP ONLY): I-stat hCG, quantitative: <5 m[IU]/mL (ref <5)

## 2019-04-27 MED ORDER — IBUPROFEN 800 MG PO TABS
800.0000 mg | ORAL_TABLET | Freq: Once | ORAL | Status: AC
  Administered 2019-04-27: 800 mg via ORAL
  Filled 2019-04-27: qty 1

## 2019-04-27 MED ORDER — FENTANYL CITRATE (PF) 100 MCG/2ML IJ SOLN
25.0000 ug | Freq: Once | INTRAMUSCULAR | Status: AC | PRN
  Administered 2019-04-27: 25 ug via INTRAVENOUS
  Filled 2019-04-27: qty 2

## 2019-04-27 MED ORDER — HYDROCODONE-ACETAMINOPHEN 5-325 MG PO TABS
1.0000 | ORAL_TABLET | Freq: Once | ORAL | Status: AC
  Administered 2019-04-27: 1 via ORAL
  Filled 2019-04-27: qty 1
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
## 2019-04-27 NOTE — Discharge Instructions (Signed)
1.  Elevate and ice your knee for the next 48 hours.  Apply well wrapped ice pack for 20 minutes every 2-3 hours.  Use crutches for the first 2 to 3 days.  If your knee is feeling significantly improved, you can begin light weightbearing. 2.  Apply antibiotic ointment and a nonstick dressing to the abrasion on your knee.  Clean the wound and change the dressing daily. 3.  If your knee continues to have significant pain or feels unsteady, you will need a follow-up appointment with an orthopedic doctor.  The orthopedic doctors contact information is included in your discharge instructions.  If the knee improves quickly and you are able to begin bearing weight without significant pain and walking with a stable gait, you can follow-up with a family doctor in the next 7 to 10 days.
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
## 2019-08-12 ENCOUNTER — Encounter (HOSPITAL_COMMUNITY): Payer: Self-pay

## 2019-09-26 ENCOUNTER — Emergency Department (HOSPITAL_COMMUNITY)
Admission: EM | Admit: 2019-09-26 | Discharge: 2019-09-26 | Disposition: A | Discharge status: Home / Self Care | Payer: Self-pay | Attending: Emergency Medicine | Admitting: Emergency Medicine

## 2019-09-26 ENCOUNTER — Other Ambulatory Visit: Payer: Self-pay

## 2019-09-26 ENCOUNTER — Encounter (HOSPITAL_COMMUNITY): Payer: Self-pay | Admitting: *Deleted

## 2019-09-26 DIAGNOSIS — Z5321 Procedure and treatment not carried out due to patient leaving prior to being seen by health care provider: Secondary | ICD-10-CM | POA: Insufficient documentation

## 2019-09-26 DIAGNOSIS — R430 Anosmia: Secondary | ICD-10-CM | POA: Insufficient documentation

## 2019-09-26 NOTE — ED Triage Notes (Signed)
States he wants a Covid test. Has not been able to taste in 4 days. No other symptoms.

## 2019-09-27 ENCOUNTER — Encounter (HOSPITAL_COMMUNITY): Payer: Self-pay

## 2019-09-27 ENCOUNTER — Other Ambulatory Visit: Payer: Self-pay

## 2019-09-27 ENCOUNTER — Ambulatory Visit (HOSPITAL_COMMUNITY)
Admission: EM | Admit: 2019-09-27 | Discharge: 2019-09-27 | Disposition: A | Discharge status: Home / Self Care | Payer: HRSA Program | Attending: Family Medicine | Admitting: Family Medicine

## 2019-09-27 DIAGNOSIS — B349 Viral infection, unspecified: Secondary | ICD-10-CM | POA: Insufficient documentation

## 2019-09-27 DIAGNOSIS — Z20822 Contact with and (suspected) exposure to covid-19: Secondary | ICD-10-CM | POA: Diagnosis present

## 2019-09-27 NOTE — Discharge Instructions (Addendum)
Your COVID 19 results will be available in 24-48 hours. Negative results are immediately resulted to Mychart. Positive results will receive a follow-up call from our clinic.  Given your close exposure to someone positive for COVID-19 all to quarantine as you are symptomatic for 10 days.

## 2019-09-27 NOTE — ED Provider Notes (Signed)
[facility]    CSN: [id number] Arrival date & time: 09/27/19  1114      History   Chief Complaint Chief Complaint  Patient presents with  . loss of taste, HA    HPI Caroline Leon is a 29 y.o. female.   HPI Close exposure to COVID-19.  Patient reports wife recently tested positive.  Patient has symptoms of nasal congestion, loss of taste, and headache x4 days. Patient presented to [facility] on yesterday and has a COVID-19 test pending which has not resulted.  Patient left emergency department without being seen.  Patient is fully vaccinated.  Has not experienced any fever. History reviewed. No pertinent past medical history.  There are no problems to display for this patient.   Past Surgical History:  Procedure Laterality Date  . MOUTH SURGERY      OB History   No obstetric history on file.      Home Medications    Prior to Admission medications   Medication Sig Start Date End Date Taking? Authorizing Provider  acetaminophen (TYLENOL) 500 MG tablet Take 500-1,000 mg by mouth every 6 (six) hours as needed for mild pain (or cramps).    [provider]  EPINEPHrine (EPIPEN) 0.3 mg/0.3 mL SOAJ injection Inject 0.3 mLs (0.3 mg total) into the muscle as needed. 04/13/13   Benjiman Core, MD  naproxen (NAPROSYN) 500 MG tablet Take 1 tablet (500 mg total) by mouth 2 (two) times daily. 11/27/18   Janace Aris, NP    Family History Family History  Problem Relation Age of Onset  . Healthy Mother   . Healthy Father     Social History Social History   Tobacco Use  . Smoking status: Current Some Day Smoker  . Smokeless tobacco: Never Used  Vaping Use  . Vaping Use: Never used  Substance Use Topics  . Alcohol use: Yes    Comment: rare  . Drug use: Yes    Types: Marijuana    Comment: occ     Allergies   Tomato and Penicillins   Review of Systems Review of Systems Pertinent negatives listed in HPI  Physical Exam Triage  Vital Signs ED Triage Vitals [09/27/19 1338]  Enc Vitals Group     BP 125/78     Pulse Rate 74     Resp 16     Temp 98.7 F (37.1 C)     Temp Source Oral     SpO2 99 %     Weight 120 lb (54.4 kg)     Height 5\' 7"  (1.702 m)     Head Circumference      Peak Flow      Pain Score 0     Pain Loc      Pain Edu?      Excl. in GC?    No data found.  Updated Vital Signs BP 125/78   Pulse 74   Temp 98.7 F (37.1 C) (Oral)   Resp 16   Ht 5\' 7"  (1.702 m)   Wt 120 lb (54.4 kg)   SpO2 99%   BMI 18.79 kg/m   Visual Acuity Right Eye Distance:   Left Eye Distance:   Bilateral Distance:    Right Eye Near:   Left Eye Near:    Bilateral Near:     Physical Exam General appearance: alert, well developed, well nourished, cooperative and in no distress Head: Normocephalic, without obvious abnormality, atraumatic Respiratory: Respirations even and unlabored, normal  respiratory rate Heart: rate and rhythm normal. No gallop or murmurs noted on exam  Abdomen: BS +, no distention, no rebound tenderness, or no mass Extremities: No gross deformities Skin: Skin color, texture, turgor normal. No rashes seen  Psych: Appropriate mood and affect. Neurologic: Mental status: Alert, oriented to person, place, and time, thought content appropriate.   UC Treatments / Results  Labs (all labs ordered are listed, but only abnormal results are displayed) Labs Reviewed  SARS CORONAVIRUS 2 (TAT 6-24 HRS)    EKG   Radiology No results found.  Procedures Procedures (including critical care time)  Medications Ordered in UC Medications - No data to display  Initial Impression / Assessment and Plan / UC Course  I have reviewed the triage vital signs and the nursing notes.  Pertinent labs & imaging results that were available during my care of the patient were reviewed by me and considered in my medical decision making (see chart for details).    Given symptoms and contact with exposure  patient advised to quarantine for minimum of 7 days as she is fully vaccinated however she is symptomatic.  If Covid test is positive she will need to quarantine the full 10 days which will allow her to go back to work after 10/08/2019.  Work note provided.  Discussed strict ER precautions if any respiratory symptoms such as shortness of breath or severe chest pressure develops. Final Clinical Impressions(s) / UC Diagnoses   Final diagnoses:  Close exposure to COVID-19 virus  Viral illness     Discharge Instructions     Your COVID 19 results will be available in 24-48 hours. Negative results are immediately resulted to Mychart. Positive results will receive a follow-up call from our clinic.  Given your close exposure to someone positive for COVID-19 all to quarantine as you are symptomatic for 10 days.    ED Prescriptions    None     PDMP not reviewed this encounter.   Bing Neighbors, FNP 09/27/19 1415

## 2019-09-27 NOTE — ED Triage Notes (Signed)
Pt c/o loss of taste, HA, nasal drainagex4 days. Pt states wife is COVID +.

## 2019-09-28 LAB — SARS CORONAVIRUS 2 (TAT 6-24 HRS): SARS Coronavirus 2: NEGATIVE

## 2020-06-23 ENCOUNTER — Encounter (HOSPITAL_COMMUNITY): Payer: Self-pay | Admitting: Emergency Medicine

## 2020-06-23 ENCOUNTER — Other Ambulatory Visit: Payer: Self-pay

## 2020-06-23 ENCOUNTER — Ambulatory Visit (HOSPITAL_COMMUNITY)
Admission: EM | Admit: 2020-06-23 | Discharge: 2020-06-23 | Disposition: A | Discharge status: Home / Self Care | Payer: Self-pay | Attending: Physician Assistant | Admitting: Physician Assistant

## 2020-06-23 DIAGNOSIS — R1030 Lower abdominal pain, unspecified: Secondary | ICD-10-CM

## 2020-06-23 DIAGNOSIS — R8290 Unspecified abnormal findings in urine: Secondary | ICD-10-CM

## 2020-06-23 DIAGNOSIS — R197 Diarrhea, unspecified: Secondary | ICD-10-CM

## 2020-06-23 DIAGNOSIS — R102 Pelvic and perineal pain: Secondary | ICD-10-CM

## 2020-06-23 LAB — COMPREHENSIVE METABOLIC PANEL
ALT: 19 U/L (ref 0–44)
AST: 20 U/L (ref 15–41)
Albumin: 4.3 g/dL (ref 3.5–5.0)
Alkaline Phosphatase: 76 U/L (ref 38–126)
Anion gap: 7 (ref 5–15)
BUN: 11 mg/dL (ref 6–20)
CO2: 27 mmol/L (ref 22–32)
Calcium: 9.6 mg/dL (ref 8.9–10.3)
Chloride: 103 mmol/L (ref 98–111)
Creatinine, Ser: 0.59 mg/dL (ref 0.44–1.00)
GFR, Estimated: >60 mL/min (ref >60)
Glucose, Bld: 77 mg/dL (ref 70–99)
Potassium: 3.9 mmol/L (ref 3.5–5.1)
Sodium: 137 mmol/L (ref 135–145)
Total Bilirubin: 0.7 mg/dL (ref 0.3–1.2)
Total Protein: 7.7 g/dL (ref 6.5–8.1)

## 2020-06-23 LAB — POCT URINALYSIS DIPSTICK, ED / UC
Bilirubin Urine: NEGATIVE
Glucose, UA: NEGATIVE mg/dL
Hgb urine dipstick: NEGATIVE
Ketones, ur: NEGATIVE mg/dL
Leukocytes,Ua: NEGATIVE
Nitrite: POSITIVE — AB
Protein, ur: NEGATIVE mg/dL
Specific Gravity, Urine: 1.025 (ref 1.005–1.030)
Urobilinogen, UA: 2 mg/dL — ABNORMAL HIGH (ref 0.0–1.0)
pH: 5.5 (ref 5.0–8.0)

## 2020-06-23 LAB — CBC WITH DIFFERENTIAL/PLATELET
Abs Immature Granulocytes: 0.02 10*3/uL (ref 0.00–0.07)
Basophils Absolute: 0 10*3/uL (ref 0.0–0.1)
Basophils Relative: 1 %
Eosinophils Absolute: 0 10*3/uL (ref 0.0–0.5)
Eosinophils Relative: 1 %
HCT: 36.9 % (ref 36.0–46.0)
Hemoglobin: 12.1 g/dL (ref 12.0–15.0)
Immature Granulocytes: 0 %
Lymphocytes Relative: 36 %
Lymphs Abs: 2.4 10*3/uL (ref 0.7–4.0)
MCH: 26.7 pg (ref 26.0–34.0)
MCHC: 32.8 g/dL (ref 30.0–36.0)
MCV: 81.5 fL (ref 80.0–100.0)
Monocytes Absolute: 0.6 10*3/uL (ref 0.1–1.0)
Monocytes Relative: 8 %
Neutro Abs: 3.7 10*3/uL (ref 1.7–7.7)
Neutrophils Relative %: 54 %
Platelets: 224 10*3/uL (ref 150–400)
RBC: 4.53 MIL/uL (ref 3.87–5.11)
RDW: 17.8 % — ABNORMAL HIGH (ref 11.5–15.5)
WBC: 6.7 10*3/uL (ref 4.0–10.5)
nRBC: 0 % (ref 0.0–0.2)

## 2020-06-23 LAB — POC URINE PREG, ED: Preg Test, Ur: NEGATIVE

## 2020-06-23 MED ORDER — SULFAMETHOXAZOLE-TRIMETHOPRIM 800-160 MG PO TABS: 1.0000 | ORAL_TABLET | Freq: Two times a day (BID) | ORAL | 0 refills | Status: AC

## 2020-06-23 NOTE — ED Provider Notes (Signed)
[facility]    CSN: [id number] Arrival date & time: 06/23/20  1444      History   Chief Complaint Chief Complaint  Patient presents with   Pelvic Pain   Diarrhea    HPI Caroline Leon is a 30 y.o. female.   Patient presents today with a weeklong history of lower abdominal pain and pelvic pain.  She has had several episodes of diarrhea which she describes as watery bowel movements without blood or mucus.  She denies any fever, nausea, vomiting, chest pain, shortness of breath.  She does report some vaginal discharge but states this is her typical physiological discharge and has not changed since symptom onset.  Pain is rated 8/9 on a 0-10 pain scale, localized to lower abdomen/pelvis, described as periodic sharp pains, no aggravating relieving factors identified.  She denies history of gastrointestinal disorder.  Denies history of abdominal surgery.  She denies any suspicious food intake, medication changes, recent antibiotic use.  She has no concern for pregnancy as she is sexually active with women only.     History reviewed. No pertinent past medical history.  There are no problems to display for this patient.   Past Surgical History:  Procedure Laterality Date   MOUTH SURGERY      OB History   No obstetric history on file.      Home Medications    Prior to Admission medications   Medication Sig Start Date End Date Taking? Authorizing Provider  sulfamethoxazole-trimethoprim (BACTRIM DS) 800-160 MG tablet Take 1 tablet by mouth 2 (two) times daily for 7 days. 06/23/20 06/30/20 Yes Magaline Steinberg, Noberto Retort, PA-C  acetaminophen (TYLENOL) 500 MG tablet Take 500-1,000 mg by mouth every 6 (six) hours as needed for mild pain (or cramps).    [provider]  EPINEPHrine (EPIPEN) 0.3 mg/0.3 mL SOAJ injection Inject 0.3 mLs (0.3 mg total) into the muscle as needed. 04/13/13   Benjiman Core, MD    Family History Family History  Problem Relation Age of Onset    Healthy Mother    Healthy Father     Social History Social History   Tobacco Use   Smoking status: Some Days    Pack years: 0.00   Smokeless tobacco: Never  Vaping Use   Vaping Use: Never used  Substance Use Topics   Alcohol use: Yes    Comment: rare   Drug use: Yes    Types: Marijuana    Comment: occ     Allergies   Tomato and Penicillins   Review of Systems Review of Systems  Constitutional:  Positive for activity change. Negative for appetite change, fatigue and fever.  Respiratory:  Negative for cough and shortness of breath.   Cardiovascular:  Negative for chest pain.  Gastrointestinal:  Positive for abdominal pain and diarrhea. Negative for nausea and vomiting.  Genitourinary:  Positive for vaginal discharge and vaginal pain. Negative for dysuria, frequency, urgency and vaginal bleeding.  Musculoskeletal:  Negative for arthralgias and myalgias.  Neurological:  Negative for dizziness, light-headedness and headaches.    Physical Exam Triage Vital Signs ED Triage Vitals  Enc Vitals Group     BP 06/23/20 1547 118/66     Pulse Rate 06/23/20 1547 90     Resp 06/23/20 1547 18     Temp 06/23/20 1547 98.7 F (37.1 C)     Temp Source 06/23/20 1547 Oral     SpO2 06/23/20 1547 99 %     Weight --  Height --      Head Circumference --      Peak Flow --      Pain Score 06/23/20 1545 9     Pain Loc --      Pain Edu? --      Excl. in GC? --    No data found.  Updated Vital Signs BP 118/66 (BP Location: Right Arm)   Pulse 90   Temp 98.7 F (37.1 C) (Oral)   Resp 18   SpO2 99%   Visual Acuity Right Eye Distance:   Left Eye Distance:   Bilateral Distance:    Right Eye Near:   Left Eye Near:    Bilateral Near:     Physical Exam Vitals reviewed.  Constitutional:      General: She is awake. She is not in acute distress.    Appearance: Normal appearance. She is normal weight. She is not ill-appearing.     Comments: Very pleasant female appears stated  age in no acute distress  HENT:     Head: Normocephalic and atraumatic.  Cardiovascular:     Rate and Rhythm: Normal rate and regular rhythm.     Heart sounds: Normal heart sounds, S1 normal and S2 normal. No murmur heard. Pulmonary:     Effort: Pulmonary effort is normal.     Breath sounds: Normal breath sounds. No wheezing, rhonchi or rales.     Comments: Clear to auscultation bilaterally Abdominal:     General: Bowel sounds are normal.     Palpations: Abdomen is soft.     Tenderness: There is abdominal tenderness in the right lower quadrant, suprapubic area and left lower quadrant. There is no right CVA tenderness, left CVA tenderness, guarding or rebound.     Comments: Tenderness palpation throughout lower abdomen.  No evidence of acute abdomen on physical exam.  Genitourinary:    Comments: Exam declined by patient Psychiatric:        Behavior: Behavior is cooperative.     UC Treatments / Results  Labs (all labs ordered are listed, but only abnormal results are displayed) Labs Reviewed  POCT URINALYSIS DIPSTICK, ED / UC - Abnormal; Notable for the following components:      Result Value   Urobilinogen, UA 2.0 (*)    Nitrite POSITIVE (*)    All other components within normal limits  URINE CULTURE  CBC WITH DIFFERENTIAL/PLATELET  COMPREHENSIVE METABOLIC PANEL  POC URINE PREG, ED  CERVICOVAGINAL ANCILLARY ONLY    EKG   Radiology No results found.  Procedures Procedures (including critical care time)  Medications Ordered in UC Medications - No data to display  Initial Impression / Assessment and Plan / UC Course  I have reviewed the triage vital signs and the nursing notes.  Pertinent labs & imaging results that were available during my care of the patient were reviewed by me and considered in my medical decision making (see chart for details).     Discussed that we are unable to obtain imaging in urgent care and see if symptoms do would be to go to the  emergency room.  Patient declined this today and preferred to start antibiotics covering for pelvic inflammatory disease and if symptoms do not improve overnight she will go to the ER.  UA obtained showed positive nitrites so patient was started on Bactrim DS.  Urine culture obtained results pending.  Discussed potential need to change antibiotics based on susceptibilities identified on culture.  CBC and CMP obtained today-results  pending.  Patient declined pelvic exam today.  STI swab collected today-results pending.  Unfortunately, patient is unable to tolerate cephalosporins as she has a history of penicillins and was previously given ceftriaxone and developed widespread hives.  We discussed at length alarm symptoms that warrant emergent evaluation.  Patient does not have improvement overnight she will go to the ER.  Strict return precautions given to which patient expressed understanding.   Final Clinical Impressions(s) / UC Diagnoses   Final diagnoses:  Lower abdominal pain  Diarrhea, unspecified type  Pelvic pain in female  Abnormal urine  Abnormal urinalysis     Discharge Instructions      Start Bactrim tonight.  If you develop any rash or oral lesions you need to stop the medication and be seen immediately.  If you do not have improvement of symptoms overnight or if anything worsens you need to go to the hospital tomorrow for CT scan as we discussed.  Make sure you are drinking plenty of fluid.  We will contact you with your lab results if we need to arrange any additional treatment.     ED Prescriptions     Medication Sig Dispense Auth. Provider   sulfamethoxazole-trimethoprim (BACTRIM DS) 800-160 MG tablet Take 1 tablet by mouth 2 (two) times daily for 7 days. 14 tablet Javeah Loeza, Noberto Retort, PA-C      PDMP not reviewed this encounter.   Jeani Hawking, PA-C 06/23/20 1720

## 2020-06-23 NOTE — ED Triage Notes (Signed)
Pt is present today with pelvic pain. Pt states that her pain started on the 9th of June. Pt states that she also had diarrhea for two days. Denies any other sx

## 2020-06-23 NOTE — Discharge Instructions (Addendum)
Start Bactrim tonight.  If you develop any rash or oral lesions you need to stop the medication and be seen immediately.  If you do not have improvement of symptoms overnight or if anything worsens you need to go to the hospital tomorrow for CT scan as we discussed.  Make sure you are drinking plenty of fluid.  We will contact you with your lab results if we need to arrange any additional treatment.

## 2020-06-25 LAB — URINE CULTURE: Culture: >=100000 — AB

## 2020-06-26 LAB — CERVICOVAGINAL ANCILLARY ONLY
Bacterial Vaginitis (gardnerella): POSITIVE — AB
Candida Glabrata: NEGATIVE
Candida Vaginitis: NEGATIVE
Chlamydia: NEGATIVE
Comment: NEGATIVE
Comment: NEGATIVE
Comment: NEGATIVE
Comment: NEGATIVE
Comment: NEGATIVE
Comment: NORMAL
Neisseria Gonorrhea: NEGATIVE
Trichomonas: NEGATIVE

## 2020-06-29 ENCOUNTER — Telehealth (HOSPITAL_COMMUNITY): Payer: Self-pay | Admitting: Emergency Medicine

## 2020-06-29 MED ORDER — METRONIDAZOLE 500 MG PO TABS: 500.0000 mg | ORAL_TABLET | Freq: Two times a day (BID) | ORAL | 0 refills | Status: DC

## 2020-12-18 ENCOUNTER — Ambulatory Visit (HOSPITAL_COMMUNITY)
Admission: EM | Admit: 2020-12-18 | Discharge: 2020-12-18 | Disposition: A | Discharge status: Home / Self Care | Payer: Self-pay | Attending: Family Medicine | Admitting: Family Medicine

## 2020-12-18 ENCOUNTER — Other Ambulatory Visit: Payer: Self-pay

## 2020-12-18 ENCOUNTER — Encounter (HOSPITAL_COMMUNITY): Payer: Self-pay

## 2020-12-18 DIAGNOSIS — J111 Influenza due to unidentified influenza virus with other respiratory manifestations: Secondary | ICD-10-CM

## 2020-12-18 DIAGNOSIS — J069 Acute upper respiratory infection, unspecified: Secondary | ICD-10-CM

## 2020-12-18 LAB — POC INFLUENZA A AND B ANTIGEN (URGENT CARE ONLY)
INFLUENZA A ANTIGEN, POC: NEGATIVE
INFLUENZA B ANTIGEN, POC: NEGATIVE

## 2020-12-18 MED ORDER — OSELTAMIVIR PHOSPHATE 75 MG PO CAPS: 75.0000 mg | ORAL_CAPSULE | Freq: Two times a day (BID) | ORAL | 0 refills | Status: AC

## 2020-12-18 NOTE — ED Provider Notes (Addendum)
[facility]    CSN: [id number] Arrival date & time: 12/18/20  1546      History   Chief Complaint Chief Complaint  Patient presents with   Cough   Headache    Mom tested positive for covid.    HPI Caroline Leon is a 30 y.o. female.    Cough Associated symptoms: headaches   Headache Associated symptoms: cough   Here for a 3 day h/o cough, congestion, malaise, sore throat, and some aches. Has felt hot and cold. Uncertain fever. No v/d. Her taste and smell are not normal right now, cannot taste and smell much.  Nursing staff understood pt say she had tested positive for COVID at home. To me, the pt states that the test did not result at all, neither pos nor neg.  Also, staff understood her to say her mom had tested pos for COVID recently, but to me pt states her mom tested pos for the flu. I verified with that I had understood correctly on both counts  History reviewed. No pertinent past medical history.  There are no problems to display for this patient.   Past Surgical History:  Procedure Laterality Date   MOUTH SURGERY      OB History   No obstetric history on file.      Home Medications    Prior to Admission medications   Medication Sig Start Date End Date Taking? Authorizing Provider  oseltamivir (TAMIFLU) 75 MG capsule Take 1 capsule (75 mg total) by mouth every 12 (twelve) hours for 5 days. 12/18/20 12/23/20 Yes Zenia Resides, MD  acetaminophen (TYLENOL) 500 MG tablet Take 500-1,000 mg by mouth every 6 (six) hours as needed for mild pain (or cramps).    [provider]  EPINEPHrine (EPIPEN) 0.3 mg/0.3 mL SOAJ injection Inject 0.3 mLs (0.3 mg total) into the muscle as needed. 04/13/13   Benjiman Core, MD    Family History Family History  Problem Relation Age of Onset   Healthy Mother    Healthy Father     Social History Social History   Tobacco Use   Smoking status: Some Days   Smokeless tobacco: Never  Vaping Use    Vaping Use: Never used  Substance Use Topics   Alcohol use: Yes    Comment: rare   Drug use: Yes    Types: Marijuana    Comment: occ     Allergies   Tomato and Penicillins   Review of Systems Review of Systems  Respiratory:  Positive for cough.   Neurological:  Positive for headaches.    Physical Exam Triage Vital Signs ED Triage Vitals  Enc Vitals Group     BP 12/18/20 1745 124/78     Pulse Rate 12/18/20 1745 80     Resp 12/18/20 1745 18     Temp 12/18/20 1745 97.9 F (36.6 C)     Temp Source 12/18/20 1745 Oral     SpO2 12/18/20 1745 100 %     Weight --      Height --      Head Circumference --      Peak Flow --      Pain Score 12/18/20 1746 10     Pain Loc --      Pain Edu? --      Excl. in [place]? --    No data found.  Updated Vital Signs BP 124/78 (BP Location: Left Arm)   Pulse 80   Temp 97.9 F (  36.6 C) (Oral)   Resp 18   LMP 12/18/2020   SpO2 100%   Visual Acuity Right Eye Distance:   Left Eye Distance:   Bilateral Distance:    Right Eye Near:   Left Eye Near:    Bilateral Near:     Physical Exam Vitals reviewed.  Constitutional:      Appearance: She is not ill-appearing or toxic-appearing.  HENT:     Right Ear: Tympanic membrane normal.     Left Ear: Tympanic membrane normal.     Ears:     Comments: Some cerumen bilaterally    Nose: Congestion present.     Mouth/Throat:     Mouth: Mucous membranes are moist.     Pharynx: Oropharyngeal exudate (clear dc in the posterior OP) and posterior oropharyngeal erythema (mild bilaterally) present.  Eyes:     Extraocular Movements: Extraocular movements intact.     Conjunctiva/sclera: Conjunctivae normal.     Pupils: Pupils are equal, round, and reactive to light.  Cardiovascular:     Rate and Rhythm: Normal rate and regular rhythm.     Heart sounds: No murmur heard. Pulmonary:     Effort: Pulmonary effort is normal.     Breath sounds: Normal breath sounds. No wheezing.  Musculoskeletal:      Cervical back: Neck supple.     Right lower leg: No edema.     Left lower leg: No edema.  Lymphadenopathy:     Cervical: No cervical adenopathy.  Skin:    Capillary Refill: Capillary refill takes less than 2 seconds.     Coloration: Skin is not pale.  Neurological:     General: No focal deficit present.     Mental Status: She is alert and oriented to person, place, and time.  Psychiatric:        Behavior: Behavior normal.     UC Treatments / Results  Labs (all labs ordered are listed, but only abnormal results are displayed) Labs Reviewed  POC INFLUENZA A AND B ANTIGEN (URGENT CARE ONLY)    EKG   Radiology No results found.  Procedures Procedures (including critical care time)  Medications Ordered in UC Medications - No data to display  Initial Impression / Assessment and Plan / UC Course  I have reviewed the triage vital signs and the nursing notes.  Pertinent labs & imaging results that were available during my care of the patient were reviewed by me and considered in my medical decision making (see chart for details).     Flu test negative; still strongly suspect the flu, with her mom having tested positive recently. Still, COVID testing offered and pt declines COVID testing. Tamiflu for poss flu syndrome. Final Clinical Impressions(s) / UC Diagnoses   Final diagnoses:  Viral URI  Influenza-like illness     Discharge Instructions      In case this is influenza, and the test was falsely negative (since your mom tested positive recently), I have sent in oseltamivir, to  take 1 capsule twice daily for 5 days. Rest, drink plenty of fluids. You can take nyquil as needed for your cold symptoms.     ED Prescriptions     Medication Sig Dispense Auth. Provider   oseltamivir (TAMIFLU) 75 MG capsule Take 1 capsule (75 mg total) by mouth every 12 (twelve) hours for 5 days. 10 capsule Zenia Resides, MD      PDMP not reviewed this encounter.    Zenia Resides, MD 12/18/20 [phone]  Zenia Resides, MD 12/18/20 [phone]

## 2020-12-18 NOTE — Discharge Instructions (Addendum)
In case this is influenza, and the test was falsely negative (since your mom tested positive recently), I have sent in oseltamivir, to  take 1 capsule twice daily for 5 days. Rest, drink plenty of fluids. You can take nyquil as needed for your cold symptoms.

## 2020-12-18 NOTE — ED Triage Notes (Signed)
Mom tested positive for covid a couple if days ago.

## 2020-12-18 NOTE — ED Triage Notes (Signed)
Pt presents with cough and congestion x 2 days. Mom tested positive for cvoid.

## 2021-02-23 ENCOUNTER — Ambulatory Visit (HOSPITAL_COMMUNITY)
Admission: EM | Admit: 2021-02-23 | Discharge: 2021-02-23 | Disposition: A | Discharge status: Home / Self Care | Payer: 59 | Attending: Student | Admitting: Student

## 2021-02-23 ENCOUNTER — Other Ambulatory Visit: Payer: Self-pay

## 2021-02-23 ENCOUNTER — Encounter (HOSPITAL_COMMUNITY): Payer: Self-pay | Admitting: *Deleted

## 2021-02-23 DIAGNOSIS — B349 Viral infection, unspecified: Secondary | ICD-10-CM

## 2021-02-23 DIAGNOSIS — Z20822 Contact with and (suspected) exposure to covid-19: Secondary | ICD-10-CM

## 2021-02-23 MED ORDER — IBUPROFEN 600 MG PO TABS: 600.0000 mg | ORAL_TABLET | Freq: Four times a day (QID) | ORAL | 0 refills | Status: DC | PRN

## 2021-02-23 NOTE — ED Triage Notes (Signed)
C/O HA, cough at night, loss of taste onset 3 days ago. States has felt some SOB onset 2 days ago. No known fevers.

## 2021-02-23 NOTE — ED Provider Notes (Signed)
[facility]    CSN: [id number] Arrival date & time: 02/23/21  1403      History   Chief Complaint Chief Complaint  Patient presents with   Headache   Cough    HPI Caroline Leon is a 31 y.o. female presenting with viral syndrome for 3 days.  History noncontributory, denies history of cardiopulmonary disease, she is a every day smoker.  Describes nonproductive cough, loss of taste, generalized malaise, headaches.  Has attempted Mucinex with some relief.  Some shortness of breath on day 2, this seems to have resolved at present time.  Denies chest pain, known fevers.  States she cannot be pregnant.  HPI  History reviewed. No pertinent past medical history.  There are no problems to display for this patient.   Past Surgical History:  Procedure Laterality Date   MOUTH SURGERY      OB History   No obstetric history on file.      Home Medications    Prior to Admission medications   Medication Sig Start Date End Date Taking? Authorizing Provider  ibuprofen (ADVIL) 600 MG tablet Take 1 tablet (600 mg total) by mouth every 6 (six) hours as needed. 02/23/21  Yes Hazel Sams, PA-C  acetaminophen (TYLENOL) 500 MG tablet Take 500-1,000 mg by mouth every 6 (six) hours as needed for mild pain (or cramps).    [provider]  EPINEPHrine (EPIPEN) 0.3 mg/0.3 mL SOAJ injection Inject 0.3 mLs (0.3 mg total) into the muscle as needed. 04/13/13   Davonna Belling, MD    Family History Family History  Problem Relation Age of Onset   Healthy Mother    Healthy Father     Social History Social History   Tobacco Use   Smoking status: Every Day    Types: Cigarettes   Smokeless tobacco: Never  Vaping Use   Vaping Use: Never used  Substance Use Topics   Alcohol use: Yes    Comment: occasionally   Drug use: Yes    Types: Marijuana     Allergies   Tomato and Penicillins   Review of Systems Review of Systems  Constitutional:  Negative for appetite  change, chills and fever.  HENT:  Positive for congestion and sore throat. Negative for ear pain, rhinorrhea, sinus pressure and sinus pain.   Eyes:  Negative for redness and visual disturbance.  Respiratory:  Positive for cough. Negative for chest tightness, shortness of breath and wheezing.   Cardiovascular:  Negative for chest pain and palpitations.  Gastrointestinal:  Negative for abdominal pain, constipation, diarrhea, nausea and vomiting.  Genitourinary:  Negative for dysuria, frequency and urgency.  Musculoskeletal:  Negative for myalgias.  Neurological:  Negative for dizziness, weakness and headaches.  Psychiatric/Behavioral:  Negative for confusion.   All other systems reviewed and are negative.   Physical Exam Triage Vital Signs ED Triage Vitals  Enc Vitals Group     BP 02/23/21 1516 122/79     Pulse Rate 02/23/21 1516 84     Resp 02/23/21 1516 16     Temp 02/23/21 1516 97.7 F (36.5 C)     Temp Source 02/23/21 1516 Oral     SpO2 02/23/21 1516 100 %     Weight --      Height --      Head Circumference --      Peak Flow --      Pain Score 02/23/21 1518 10     Pain Loc --  Pain Edu? --      Excl. in [place]? --    No data found.  Updated Vital Signs BP 122/79    Pulse 84    Temp 97.7 F (36.5 C) (Oral)    Resp 16    LMP 02/07/2021 (Exact Date)    SpO2 100%   Visual Acuity Right Eye Distance:   Left Eye Distance:   Bilateral Distance:    Right Eye Near:   Left Eye Near:    Bilateral Near:     Physical Exam Vitals reviewed.  Constitutional:      General: She is not in acute distress.    Appearance: Normal appearance. She is not ill-appearing.  HENT:     Head: Normocephalic and atraumatic.     Right Ear: Tympanic membrane, ear canal and external ear normal. No tenderness. No middle ear effusion. There is no impacted cerumen. Tympanic membrane is not perforated, erythematous, retracted or bulging.     Left Ear: Tympanic membrane, ear canal and external ear  normal. No tenderness.  No middle ear effusion. There is no impacted cerumen. Tympanic membrane is not perforated, erythematous, retracted or bulging.     Nose: Nose normal. No congestion.     Mouth/Throat:     Mouth: Mucous membranes are moist.     Pharynx: Uvula midline. No oropharyngeal exudate or posterior oropharyngeal erythema.  Eyes:     Extraocular Movements: Extraocular movements intact.     Pupils: Pupils are equal, round, and reactive to light.  Cardiovascular:     Rate and Rhythm: Normal rate and regular rhythm.     Heart sounds: Normal heart sounds.  Pulmonary:     Effort: Pulmonary effort is normal.     Breath sounds: Normal breath sounds. No decreased breath sounds, wheezing, rhonchi or rales.  Abdominal:     Palpations: Abdomen is soft.     Tenderness: There is no abdominal tenderness. There is no guarding or rebound.  Lymphadenopathy:     Cervical: No cervical adenopathy.     Right cervical: No superficial cervical adenopathy.    Left cervical: No superficial cervical adenopathy.  Neurological:     General: No focal deficit present.     Mental Status: She is alert and oriented to person, place, and time.  Psychiatric:        Mood and Affect: Mood normal.        Behavior: Behavior normal.        Thought Content: Thought content normal.        Judgment: Judgment normal.     UC Treatments / Results  Labs (all labs ordered are listed, but only abnormal results are displayed) Labs Reviewed  SARS CORONAVIRUS 2 (TAT 6-24 HRS)    EKG   Radiology No results found.  Procedures Procedures (including critical care time)  Medications Ordered in UC Medications - No data to display  Initial Impression / Assessment and Plan / UC Course  I have reviewed the triage vital signs and the nursing notes.  Pertinent labs & imaging results that were available during my care of the patient were reviewed by me and considered in my medical decision making (see chart for  details).     This patient is a very pleasant 31 y.o. year old female presenting with viral syndrome x3 days. Afebrile, nontachy. LMP 02/07/21, States she is not pregnant or breastfeeding.  Covid PCR sent.    Work note provided. ED return precautions discussed. Patient verbalizes understanding and  agreement.    Final Clinical Impressions(s) / UC Diagnoses   Final diagnoses:  Encounter for laboratory testing for COVID-19 virus  Viral syndrome     Discharge Instructions      -You can take Tylenol up to 1000 mg 3 times daily, and ibuprofen up to 600 mg 3-4 times daily with food.  You can take these together, or alternate every 3-4 hours. -Continue over-the-counter medications for additional relief - Mucinex, etc -With a virus, you're typically contagious for 5-7 days, or as long as you're having fevers.      ED Prescriptions     Medication Sig Dispense Auth. Provider   ibuprofen (ADVIL) 600 MG tablet Take 1 tablet (600 mg total) by mouth every 6 (six) hours as needed. 30 tablet Hazel Sams, PA-C      PDMP not reviewed this encounter.   Hazel Sams, PA-C 02/23/21 1540

## 2021-02-23 NOTE — Discharge Instructions (Addendum)
-  You can take Tylenol up to 1000 mg 3 times daily, and ibuprofen up to 600 mg 3-4 times daily with food.  You can take these together, or alternate every 3-4 hours. -Continue over-the-counter medications for additional relief - Mucinex, etc -With a virus, you're typically contagious for 5-7 days, or as long as you're having fevers.

## 2021-02-24 LAB — SARS CORONAVIRUS 2 (TAT 6-24 HRS): SARS Coronavirus 2: NEGATIVE

## 2021-06-03 ENCOUNTER — Encounter (HOSPITAL_COMMUNITY): Payer: Self-pay

## 2021-06-03 ENCOUNTER — Ambulatory Visit (HOSPITAL_COMMUNITY)
Admission: EM | Admit: 2021-06-03 | Discharge: 2021-06-03 | Disposition: A | Discharge status: Home / Self Care | Payer: 59 | Attending: Internal Medicine | Admitting: Internal Medicine

## 2021-06-03 DIAGNOSIS — R112 Nausea with vomiting, unspecified: Secondary | ICD-10-CM | POA: Insufficient documentation

## 2021-06-03 DIAGNOSIS — R197 Diarrhea, unspecified: Secondary | ICD-10-CM | POA: Insufficient documentation

## 2021-06-03 DIAGNOSIS — N12 Tubulo-interstitial nephritis, not specified as acute or chronic: Secondary | ICD-10-CM | POA: Insufficient documentation

## 2021-06-03 LAB — COMPREHENSIVE METABOLIC PANEL
ALT: 21 U/L (ref 0–44)
AST: 23 U/L (ref 15–41)
Albumin: 4.4 g/dL (ref 3.5–5.0)
Alkaline Phosphatase: 86 U/L (ref 38–126)
Anion gap: 11 (ref 5–15)
BUN: 10 mg/dL (ref 6–20)
CO2: 22 mmol/L (ref 22–32)
Calcium: 9.8 mg/dL (ref 8.9–10.3)
Chloride: 103 mmol/L (ref 98–111)
Creatinine, Ser: 0.62 mg/dL (ref 0.44–1.00)
GFR, Estimated: >60 mL/min (ref >60)
Glucose, Bld: 86 mg/dL (ref 70–99)
Potassium: 3.9 mmol/L (ref 3.5–5.1)
Sodium: 136 mmol/L (ref 135–145)
Total Bilirubin: 1 mg/dL (ref 0.3–1.2)
Total Protein: 8.1 g/dL (ref 6.5–8.1)

## 2021-06-03 LAB — POCT URINALYSIS DIPSTICK, ED / UC
Bilirubin Urine: NEGATIVE
Glucose, UA: NEGATIVE mg/dL
Ketones, ur: NEGATIVE mg/dL
Leukocytes,Ua: NEGATIVE
Nitrite: POSITIVE — AB
Protein, ur: 30 mg/dL — AB
Specific Gravity, Urine: 1.025 (ref 1.005–1.030)
Urobilinogen, UA: 0.2 mg/dL (ref 0.0–1.0)
pH: 5.5 (ref 5.0–8.0)

## 2021-06-03 LAB — CBC
HCT: 34.7 % — ABNORMAL LOW (ref 36.0–46.0)
Hemoglobin: 11.3 g/dL — ABNORMAL LOW (ref 12.0–15.0)
MCH: 24.7 pg — ABNORMAL LOW (ref 26.0–34.0)
MCHC: 32.6 g/dL (ref 30.0–36.0)
MCV: 75.8 fL — ABNORMAL LOW (ref 80.0–100.0)
Platelets: 295 10*3/uL (ref 150–400)
RBC: 4.58 MIL/uL (ref 3.87–5.11)
RDW: 16.9 % — ABNORMAL HIGH (ref 11.5–15.5)
WBC: 4.9 10*3/uL (ref 4.0–10.5)
nRBC: 0 % (ref 0.0–0.2)

## 2021-06-03 LAB — LIPASE, BLOOD: Lipase: 25 U/L (ref 11–51)

## 2021-06-03 LAB — AMYLASE: Amylase: 80 U/L (ref 28–100)

## 2021-06-03 LAB — POC URINE PREG, ED: Preg Test, Ur: NEGATIVE

## 2021-06-03 MED ORDER — KETOROLAC TROMETHAMINE 30 MG/ML IJ SOLN
INTRAMUSCULAR | Status: AC
  Filled 2021-06-03: qty 1

## 2021-06-03 MED ORDER — ACETAMINOPHEN 500 MG PO TABS: 500.0000 mg | ORAL_TABLET | Freq: Four times a day (QID) | ORAL | 1 refills | Status: DC | PRN

## 2021-06-03 MED ORDER — ONDANSETRON HCL 4 MG PO TABS: 4.0000 mg | ORAL_TABLET | Freq: Three times a day (TID) | ORAL | 0 refills | Status: DC | PRN

## 2021-06-03 MED ORDER — ONDANSETRON 4 MG PO TBDP
ORAL_TABLET | ORAL | Status: AC
  Filled 2021-06-03: qty 1

## 2021-06-03 MED ORDER — ONDANSETRON 4 MG PO TBDP
4.0000 mg | ORAL_TABLET | Freq: Once | ORAL | Status: AC
  Administered 2021-06-03: 4 mg via ORAL

## 2021-06-03 MED ORDER — NITROFURANTOIN MONOHYD MACRO 100 MG PO CAPS: 100.0000 mg | ORAL_CAPSULE | Freq: Two times a day (BID) | ORAL | 0 refills | Status: DC

## 2021-06-03 MED ORDER — KETOROLAC TROMETHAMINE 30 MG/ML IJ SOLN
30.0000 mg | Freq: Once | INTRAMUSCULAR | Status: AC
  Administered 2021-06-03: 30 mg via INTRAMUSCULAR

## 2021-06-03 MED ORDER — CIPROFLOXACIN HCL 500 MG PO TABS: 500.0000 mg | ORAL_TABLET | Freq: Two times a day (BID) | ORAL | 0 refills | Status: DC

## 2021-06-03 NOTE — ED Triage Notes (Signed)
Patient presents to Urgent Care with complaints of nausea, vomiting and diarrhea since friday. Patient reports only able to keep water down, pepto otc. Marland Kitchen

## 2021-06-03 NOTE — ED Provider Notes (Signed)
[facility]    CSN: [id number] Arrival date & time: 06/03/21  1242      History   Chief Complaint Chief Complaint  Patient presents with   Nausea   Diarrhea   Emesis    HPI Caroline Leon is a 31 y.o. female.   Patient presents urgent care for evaluation after 3-day history of nausea, vomiting, and diarrhea.  She has been able to keep some water down, but states that she is not able to keep any food down without vomiting.  She cooked steak that she purchased from [employer] store on Thursday and ate it medium rare.  Denies any other food intake that is abnormal to her normal diet. She has had multiple episodes of diarrhea throughout the past 3 days and is currently reporting generalized abdominal pain. Reports urinary frequency and urgency, but denies dysuria. No vaginal discharge, odor, or itch. Last menstrual period was last month and is irregular. Denies chance she could be pregnant.  Attempted taking benadryl and peptobismol at home with no relief of symptoms. Denies sick contacts. Denies history of cholecystectomy and appendectomy. No recent antibiotic use or illnesses. No other aggravating or relieving factors identified at this time.    Diarrhea Associated symptoms: vomiting   Emesis Associated symptoms: diarrhea    No past medical history on file.  There are no problems to display for this patient.   Past Surgical History:  Procedure Laterality Date   MOUTH SURGERY      OB History   No obstetric history on file.      Home Medications    Prior to Admission medications   Medication Sig Start Date End Date Taking? Authorizing Provider  ciprofloxacin (CIPRO) 500 MG tablet Take 1 tablet (500 mg total) by mouth every 12 (twelve) hours. 06/03/21  Yes Carlisle Beers, FNP  acetaminophen (TYLENOL) 500 MG tablet Take 1-2 tablets (500-1,000 mg total) by mouth every 6 (six) hours as needed for mild pain (or cramps). 06/03/21   Carlisle Beers,  FNP  EPINEPHrine (EPIPEN) 0.3 mg/0.3 mL SOAJ injection Inject 0.3 mLs (0.3 mg total) into the muscle as needed. 04/13/13   Benjiman Core, MD  ibuprofen (ADVIL) 600 MG tablet Take 1 tablet (600 mg total) by mouth every 6 (six) hours as needed. 02/23/21   Rhys Martini, PA-C  ondansetron (ZOFRAN) 4 MG tablet Take 1 tablet (4 mg total) by mouth every 8 (eight) hours as needed for nausea or vomiting. 06/03/21  Yes [place], Donavan Burnet, FNP    Family History Family History  Problem Relation Age of Onset   Healthy Mother    Healthy Father     Social History Social History   Tobacco Use   Smoking status: Every Day    Packs/day: 1.00    Types: Cigarettes   Smokeless tobacco: Never  Vaping Use   Vaping Use: Never used  Substance Use Topics   Alcohol use: Yes    Comment: occasionally   Drug use: Yes    Types: Marijuana     Allergies   Tomato and Penicillins   Review of Systems Review of Systems  Gastrointestinal:  Positive for diarrhea and vomiting.  Per HPI  Physical Exam Triage Vital Signs ED Triage Vitals  Enc Vitals Group     BP 06/03/21 1325 104/81     Pulse Rate 06/03/21 1325 76     Resp 06/03/21 1325 18     Temp 06/03/21 1325 98.2 F (36.8 C)  Temp Source 06/03/21 1325 Oral     SpO2 06/03/21 1325 98 %     Weight --      Height --      Head Circumference --      Peak Flow --      Pain Score 06/03/21 1323 10     Pain Loc --      Pain Edu? --      Excl. in [place]? --    No data found.  Updated Vital Signs BP 104/81   Pulse 76   Temp 98.2 F (36.8 C) (Oral)   Resp 18   LMP 05/03/2021   SpO2 98%   Visual Acuity Right Eye Distance:   Left Eye Distance:   Bilateral Distance:    Right Eye Near:   Left Eye Near:    Bilateral Near:     Physical Exam Vitals and nursing note reviewed.  Constitutional:      General: She is not in acute distress.    Appearance: Normal appearance. She is well-developed. She is ill-appearing. She is not diaphoretic.   HENT:     Head: Normocephalic and atraumatic.     Right Ear: Tympanic membrane, ear canal and external ear normal.     Left Ear: Tympanic membrane, ear canal and external ear normal.     Nose: Nose normal.     Mouth/Throat:     Mouth: Mucous membranes are moist.     Pharynx: Oropharynx is clear. No posterior oropharyngeal erythema or uvula swelling.  Eyes:     General: Lids are normal. Vision grossly intact. No allergic shiner or visual field deficit.    Extraocular Movements: Extraocular movements intact.     Conjunctiva/sclera: Conjunctivae normal.  Cardiovascular:     Rate and Rhythm: Normal rate and regular rhythm.     Heart sounds: Normal heart sounds. No murmur heard.   No friction rub. No gallop.  Pulmonary:     Effort: Pulmonary effort is normal. No respiratory distress.     Breath sounds: Normal breath sounds. No wheezing, rhonchi or rales.  Chest:     Chest wall: No tenderness.  Abdominal:     General: Bowel sounds are normal. There is no distension.     Palpations: Abdomen is soft.     Tenderness: There is abdominal tenderness in the right upper quadrant, right lower quadrant and left lower quadrant. There is right CVA tenderness. There is no left CVA tenderness, guarding or rebound. Positive signs include Murphy's sign.     Hernia: No hernia is present.  Musculoskeletal:        General: No swelling.     Cervical back: Normal range of motion and neck supple.  Lymphadenopathy:     Cervical: No cervical adenopathy.  Skin:    General: Skin is warm and dry.     Capillary Refill: Capillary refill takes less than 2 seconds.     Findings: No rash.  Neurological:     General: No focal deficit present.     Mental Status: She is alert and oriented to person, place, and time.     Cranial Nerves: Cranial nerves 2-12 are intact.     Sensory: Sensation is intact.     Motor: Motor function is intact. No weakness.     Coordination: Coordination is intact.     Gait: Gait is  intact. Gait normal.  Psychiatric:        Mood and Affect: Mood normal.  Behavior: Behavior normal.        Thought Content: Thought content normal.        Judgment: Judgment normal.     UC Treatments / Results  Labs (all labs ordered are listed, but only abnormal results are displayed) Labs Reviewed  POCT URINALYSIS DIPSTICK, ED / UC - Abnormal; Notable for the following components:      Result Value   Hgb urine dipstick TRACE (*)    Protein, ur 30 (*)    Nitrite POSITIVE (*)    All other components within normal limits  URINE CULTURE  CBC  COMPREHENSIVE METABOLIC PANEL  LIPASE, BLOOD  AMYLASE  POC URINE PREG, ED    EKG   Radiology No results found.  Procedures Procedures (including critical care time)  Medications Ordered in UC Medications  ondansetron (ZOFRAN-ODT) disintegrating tablet 4 mg (4 mg Oral Given 06/03/21 1329)  ketorolac (TORADOL) 30 MG/ML injection 30 mg (30 mg Intramuscular Given 06/03/21 1432)    Initial Impression / Assessment and Plan / UC Course  I have reviewed the triage vital signs and the nursing notes.  Pertinent labs & imaging results that were available during my care of the patient were reviewed by me and considered in my medical decision making (see chart for details).  Patient is a 31 year old female presenting to urgent care today with right upper quadrant abdominal pain and 3-day history of nausea, vomiting, and diarrhea.    Abdominal exam reveals positive Murphy sign.  Unable to rule out viral gastroenteritis versus cholecystitis/biliary colic.  Discussed treatment plan options with patient.  I recommend for her to go to the emergency department immediately for ultrasound to rule out cholecystitis and gallstones.  Also offered to draw CBC, CMP, amylase, and lipase in clinic today which will all result in a few hours.  I will call patient if any results are abnormal requiring her to report to the emergency department immediately.   Discussed risks and benefits of both options.  Patient chose to defer emergency department visit at this time and have labs drawn in clinic today.    Plan to give patient ketorolac 30 mg injection IM for right upper quadrant pain that is currently an 8 on a scale of 0-10 prior to pain medication. Patient given Zofran in clinic today 4 mg ODT with significant improvement in symptoms. Prescription for 4 mg Zofran ODT to be taken every 8 hours as needed at home for nausea given.  She may take ibuprofen/Tylenol every 6 hours as needed for pain.  No ibuprofen or other NSAID containing medications until tomorrow due to ketorolac injection in clinic today.  Urinalysis shows trace hemoglobin, 30 protein, and positive nitrites.  Specific gravity is normal.  Plan to treat UTI with ciprofloxacin twice daily for the next 5 days for possible pyelonephritis due to right CVA tenderness. Urine pregnancy test is negative. Urine culture pending. Patient will be notified if change in treatment is needed.   Counseled patient regarding appropriate use of medications and potential side effects for all medications recommended or prescribed today. Discussed red flag signs and symptoms of worsening condition,when to call the PCP office, return to urgent care, and when to seek higher level of care. Patient verbalizes understanding and agreement with plan. All questions answered. Patient discharged in stable condition.  Final Clinical Impressions(s) / UC Diagnoses   Final diagnoses:  Nausea vomiting and diarrhea  Pyelonephritis     Discharge Instructions      You were seen  urgent care today for nausea, vomiting, and diarrhea.  Take Zofran as needed every 8 hours at home for nausea.  Your next dose of Zofran can be tonight at 10 PM since you were given some clinic today.  You were given a shot of a strong NSAID medication in the clinic today for pain.  Do not take any ibuprofen until tomorrow morning.  You may take ibuprofen  and Tylenol starting tomorrow every 6 hours as needed for your abdominal pain with food.  I will call you later this afternoon/tonight with lab results.  If your results are normal, you will not hear from me.  If your results are abnormal, or you need to go to the emergency department for an ultrasound of your gallbladder, I will give you a phone call.  If your symptoms become worse or if you develop any new symptoms, please go to the emergency department immediately for evaluation.  Take ciprofloxacin twice daily for the next 5 days to treat your urinary tract infection.  Drink plenty of water.  Eat a bland diet for the next 12 to 24 hours to allow your stomach to heal.  If you develop any new or worsening symptoms or do not improve in the next 2 to 3 days, please return.  If your symptoms are severe, please go to the emergency room.  Follow-up with your primary care provider for further evaluation and management of your symptoms as well as ongoing wellness visits.  I hope you feel better!     ED Prescriptions     Medication Sig Dispense Auth. Provider   ondansetron (ZOFRAN) 4 MG tablet Take 1 tablet (4 mg total) by mouth every 8 (eight) hours as needed for nausea or vomiting. 20 tablet Joella Prince M, FNP   acetaminophen (TYLENOL) 500 MG tablet Take 1-2 tablets (500-1,000 mg total) by mouth every 6 (six) hours as needed for mild pain (or cramps). 30 tablet Joella Prince M, FNP   nitrofurantoin, macrocrystal-monohydrate, (MACROBID) 100 MG capsule  (Status: Discontinued) Take 1 capsule (100 mg total) by mouth 2 (two) times daily. 10 capsule Talbot Grumbling, FNP   ciprofloxacin (CIPRO) 500 MG tablet Take 1 tablet (500 mg total) by mouth every 12 (twelve) hours. 10 tablet Talbot Grumbling, FNP      PDMP not reviewed this encounter.   Talbot Grumbling, [place] 06/03/21 1452

## 2021-06-03 NOTE — Discharge Instructions (Addendum)
You were seen urgent care today for nausea, vomiting, and diarrhea.  Take Zofran as needed every 8 hours at home for nausea.  Your next dose of Zofran can be tonight at 10 PM since you were given some clinic today.  You were given a shot of a strong NSAID medication in the clinic today for pain.  Do not take any ibuprofen until tomorrow morning.  You may take ibuprofen and Tylenol starting tomorrow every 6 hours as needed for your abdominal pain with food.  I will call you later this afternoon/tonight with lab results.  If your results are normal, you will not hear from me.  If your results are abnormal, or you need to go to the emergency department for an ultrasound of your gallbladder, I will give you a phone call.  If your symptoms become worse or if you develop any new symptoms, please go to the emergency department immediately for evaluation.  Take ciprofloxacin twice daily for the next 5 days to treat your urinary tract infection.  Drink plenty of water.  Eat a bland diet for the next 12 to 24 hours to allow your stomach to heal.  If you develop any new or worsening symptoms or do not improve in the next 2 to 3 days, please return.  If your symptoms are severe, please go to the emergency room.  Follow-up with your primary care provider for further evaluation and management of your symptoms as well as ongoing wellness visits.  I hope you feel better!

## 2021-06-05 LAB — URINE CULTURE

## 2021-09-08 ENCOUNTER — Encounter (HOSPITAL_COMMUNITY): Payer: Self-pay | Admitting: Emergency Medicine

## 2021-09-08 ENCOUNTER — Ambulatory Visit (HOSPITAL_COMMUNITY)
Admission: EM | Admit: 2021-09-08 | Discharge: 2021-09-08 | Disposition: A | Discharge status: Home / Self Care | Payer: Self-pay | Attending: Urgent Care | Admitting: Urgent Care

## 2021-09-08 ENCOUNTER — Ambulatory Visit (INDEPENDENT_AMBULATORY_CARE_PROVIDER_SITE_OTHER): Payer: Self-pay

## 2021-09-08 DIAGNOSIS — J069 Acute upper respiratory infection, unspecified: Secondary | ICD-10-CM

## 2021-09-08 DIAGNOSIS — Z20822 Contact with and (suspected) exposure to covid-19: Secondary | ICD-10-CM | POA: Insufficient documentation

## 2021-09-08 DIAGNOSIS — R059 Cough, unspecified: Secondary | ICD-10-CM | POA: Insufficient documentation

## 2021-09-08 LAB — RESP PANEL BY RT-PCR (RSV, FLU A&B, COVID)  RVPGX2
Influenza A by PCR: NEGATIVE
Influenza B by PCR: NEGATIVE
Resp Syncytial Virus by PCR: NEGATIVE
SARS Coronavirus 2 by RT PCR: NEGATIVE

## 2021-09-08 MED ORDER — NAPROXEN 500 MG PO TABS: 500.0000 mg | ORAL_TABLET | Freq: Two times a day (BID) | ORAL | 0 refills | Status: AC

## 2021-09-08 NOTE — Discharge Instructions (Signed)
Your chest x-ray reveals no source of pneumonia or pulmonary infection. We have swabbed you today for COVID, flu, RSV. Your results will be available later this evening. Please continue taking your Mucinex as this will help break up the phlegm and improve your cough. I have prescribed an anti-inflammatory for you to take to help with the discomfort. A warm moist cloth may provide you some relief to your chest discomfort. RTC if you develop a fever or worsening symptoms.

## 2021-09-08 NOTE — ED Triage Notes (Signed)
Pt had productive cough with yellowish phlegm for 3 days. Has thoracic pains with coughing.

## 2021-09-08 NOTE — ED Provider Notes (Signed)
[facility]    CSN: [id number] Arrival date & time: 09/08/21  1219      History   Chief Complaint Chief Complaint  Patient presents with   Cough    HPI Caroline Leon is a 31 y.o. female.   Pleasant 31 year old female presents due to concerns of a productive cough and right sided chest pain.  She states she started with some URI symptoms including rhinorrhea, sneezing, and postnasal drip.  She states over the past 2 to 3 days she has started having a productive cough of yellow thick phlegm.  Over the past 24 hours she has developed right-sided rib and chest pain, much worse with coughing. Feels tight in the chest.  She denies a fever.  She denies any known COVID exposure.  States young children at home were sick first.  She is tried over-the-counter Mucinex only without symptom resolution.  Has not tried Tylenol or ibuprofen.  Denies history of asthma, takes no pulmonary medications.  Patient does smoke.   Cough   History reviewed. No pertinent past medical history.  There are no problems to display for this patient.   Past Surgical History:  Procedure Laterality Date   MOUTH SURGERY      OB History   No obstetric history on file.      Home Medications    Prior to Admission medications   Medication Sig Start Date End Date Taking? Authorizing Provider  naproxen (NAPROSYN) 500 MG tablet Take 1 tablet (500 mg total) by mouth 2 (two) times daily with a meal for 7 days. 09/08/21 09/15/21 Yes Jayma Volpi, Jodelle Gross, PA    Family History Family History  Problem Relation Age of Onset   Healthy Mother    Healthy Father     Social History Social History   Tobacco Use   Smoking status: Every Day    Packs/day: 1.00    Types: Cigarettes   Smokeless tobacco: Never  Vaping Use   Vaping Use: Never used  Substance Use Topics   Alcohol use: Yes    Comment: occasionally   Drug use: Yes    Types: Marijuana     Allergies   Tomato and Penicillins   Review of  Systems Review of Systems  Respiratory:  Positive for cough.   As per HPI   Physical Exam Triage Vital Signs ED Triage Vitals [09/08/21 1306]  Enc Vitals Group     BP 108/69     Pulse Rate 86     Resp 15     Temp 99 F (37.2 C)     Temp src      SpO2 98 %     Weight      Height      Head Circumference      Peak Flow      Pain Score 8     Pain Loc      Pain Edu?      Excl. in [place]?    No data found.  Updated Vital Signs BP 108/69 (BP Location: Left Arm)   Pulse 86   Temp 99 F (37.2 C)   Resp 15   LMP 08/19/2021   SpO2 98%   Visual Acuity Right Eye Distance:   Left Eye Distance:   Bilateral Distance:    Right Eye Near:   Left Eye Near:    Bilateral Near:     Physical Exam Vitals and nursing note reviewed.  Constitutional:      General: She is  not in acute distress.    Appearance: Normal appearance. She is well-developed. She is not ill-appearing, toxic-appearing or diaphoretic.  HENT:     Head: Normocephalic and atraumatic.     Right Ear: Tympanic membrane, ear canal and external ear normal. There is no impacted cerumen.     Left Ear: Tympanic membrane, ear canal and external ear normal. There is no impacted cerumen.     Nose: Nose normal. No congestion or rhinorrhea.     Mouth/Throat:     Mouth: Mucous membranes are moist.     Pharynx: Oropharynx is clear. No oropharyngeal exudate or posterior oropharyngeal erythema.  Eyes:     Conjunctiva/sclera: Conjunctivae normal.  Cardiovascular:     Rate and Rhythm: Normal rate and regular rhythm.     Pulses: Normal pulses.     Heart sounds: Normal heart sounds. No murmur heard.    No friction rub. No gallop.  Pulmonary:     Effort: Pulmonary effort is normal. No respiratory distress.     Breath sounds: No wheezing, rhonchi or rales.     Comments: Decreased breath sounds noted primarily to the RUL posterior lung field only Chest:     Chest wall: Tenderness present.  Abdominal:     Palpations: Abdomen is  soft.     Tenderness: There is no abdominal tenderness.  Musculoskeletal:        General: No swelling.     Cervical back: Neck supple.  Skin:    General: Skin is warm and dry.     Capillary Refill: Capillary refill takes less than 2 seconds.     Coloration: Skin is not jaundiced.     Findings: No erythema or rash.  Neurological:     General: No focal deficit present.     Mental Status: She is alert and oriented to person, place, and time.  Psychiatric:        Mood and Affect: Mood normal.      UC Treatments / Results  Labs (all labs ordered are listed, but only abnormal results are displayed) Labs Reviewed  RESP PANEL BY RT-PCR (RSV, FLU A&B, COVID)  [id number]    EKG   Radiology DG Chest 2 View  Result Date: 09/08/2021 CLINICAL DATA:  Three days of a productive cough. Right-sided chest pain. EXAM: CHEST - 2 VIEW COMPARISON:  April 27, 2019 FINDINGS: The heart size and mediastinal contours are within normal limits. Both lungs are clear. The visualized skeletal structures are unremarkable. IMPRESSION: No active cardiopulmonary disease. Electronically Signed   By: Gerome Sam III M.D.   On: 09/08/2021 13:55    Procedures Procedures (including critical care time)  Medications Ordered in UC Medications - No data to display  Initial Impression / Assessment and Plan / UC Course  I have reviewed the triage vital signs and the nursing notes.  Pertinent labs & imaging results that were available during my care of the patient were reviewed by me and considered in my medical decision making (see chart for details).     Viral URI with cough -chest x-ray negative for pneumonia or other respiratory illness.  COVID flu and RSV tests obtained, will call with results.  We will treat supportively with naproxen as I suspect much of her pain is inflammatory second to the virus.  I do not suspect pleurisy at this time.  Additional supportive measures discussed.  Return to clinic precautions  reviewed.   Final Clinical Impressions(s) / UC Diagnoses   Final diagnoses:  Viral URI with cough     Discharge Instructions      Your chest x-ray reveals no source of pneumonia or pulmonary infection. We have swabbed you today for COVID, flu, RSV. Your results will be available later this evening. Please continue taking your Mucinex as this will help break up the phlegm and improve your cough. I have prescribed an anti-inflammatory for you to take to help with the discomfort. A warm moist cloth may provide you some relief to your chest discomfort. RTC if you develop a fever or worsening symptoms.     ED Prescriptions     Medication Sig Dispense Auth. Provider   naproxen (NAPROSYN) 500 MG tablet Take 1 tablet (500 mg total) by mouth 2 (two) times daily with a meal for 7 days. 14 tablet Loye Reininger L, [place]      PDMP not reviewed this encounter.   Maretta Bees, [place] 09/09/21 1659
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
## 2022-02-10 ENCOUNTER — Emergency Department (HOSPITAL_COMMUNITY): Payer: BLUE CROSS/BLUE SHIELD

## 2022-02-10 ENCOUNTER — Other Ambulatory Visit: Payer: Self-pay

## 2022-02-10 ENCOUNTER — Emergency Department (HOSPITAL_COMMUNITY)
Admission: EM | Admit: 2022-02-10 | Discharge: 2022-02-10 | Disposition: A | Discharge status: Home / Self Care | Payer: No Typology Code available for payment source | Attending: Emergency Medicine | Admitting: Emergency Medicine

## 2022-02-10 DIAGNOSIS — S161XXA Strain of muscle, fascia and tendon at neck level, initial encounter: Secondary | ICD-10-CM | POA: Diagnosis not present

## 2022-02-10 DIAGNOSIS — M542 Cervicalgia: Secondary | ICD-10-CM | POA: Diagnosis present

## 2022-02-10 DIAGNOSIS — R519 Headache, unspecified: Secondary | ICD-10-CM | POA: Diagnosis not present

## 2022-02-10 MED ORDER — MELOXICAM 7.5 MG PO TABS: 7.5000 mg | ORAL_TABLET | Freq: Every day | ORAL | 0 refills | Status: DC

## 2022-02-10 MED ORDER — LIDOCAINE 5 % EX PTCH
1.0000 | MEDICATED_PATCH | CUTANEOUS | Status: DC
  Administered 2022-02-10: 1 via TRANSDERMAL
  Filled 2022-02-10: qty 1

## 2022-02-10 MED ORDER — KETOROLAC TROMETHAMINE 15 MG/ML IJ SOLN
15.0000 mg | Freq: Once | INTRAMUSCULAR | Status: AC
  Administered 2022-02-10: 15 mg via INTRAMUSCULAR
  Filled 2022-02-10: qty 1

## 2022-02-10 MED ORDER — METHOCARBAMOL 500 MG PO TABS: 500.0000 mg | ORAL_TABLET | Freq: Four times a day (QID) | ORAL | 0 refills | Status: DC | PRN

## 2022-02-10 MED ORDER — CYCLOBENZAPRINE HCL 10 MG PO TABS
5.0000 mg | ORAL_TABLET | Freq: Once | ORAL | Status: AC
Start: 2022-02-10 — End: 2022-02-10
  Administered 2022-02-10: 5 mg via ORAL
  Filled 2022-02-10: qty 1
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
## 2022-04-06 ENCOUNTER — Ambulatory Visit (HOSPITAL_COMMUNITY)
Admission: EM | Admit: 2022-04-06 | Discharge: 2022-04-06 | Disposition: A | Discharge status: Home / Self Care | Payer: BLUE CROSS/BLUE SHIELD | Attending: Emergency Medicine | Admitting: Emergency Medicine

## 2022-04-06 ENCOUNTER — Encounter (HOSPITAL_COMMUNITY): Payer: Self-pay

## 2022-04-06 DIAGNOSIS — N10 Acute tubulo-interstitial nephritis: Secondary | ICD-10-CM | POA: Insufficient documentation

## 2022-04-06 DIAGNOSIS — R112 Nausea with vomiting, unspecified: Secondary | ICD-10-CM | POA: Insufficient documentation

## 2022-04-06 LAB — POCT URINALYSIS DIPSTICK, ED / UC
Bilirubin Urine: NEGATIVE
Glucose, UA: NEGATIVE mg/dL
Hgb urine dipstick: NEGATIVE
Ketones, ur: NEGATIVE mg/dL
Leukocytes,Ua: NEGATIVE
Nitrite: POSITIVE — AB
Protein, ur: NEGATIVE mg/dL
Specific Gravity, Urine: 1.02 (ref 1.005–1.030)
Urobilinogen, UA: 0.2 mg/dL (ref 0.0–1.0)
pH: 7 (ref 5.0–8.0)

## 2022-04-06 LAB — POC URINE PREG, ED: Preg Test, Ur: NEGATIVE

## 2022-04-06 MED ORDER — ONDANSETRON 8 MG PO TBDP: 8.0000 mg | ORAL_TABLET | Freq: Three times a day (TID) | ORAL | 0 refills | Status: DC | PRN

## 2022-04-06 MED ORDER — KETOROLAC TROMETHAMINE 30 MG/ML IJ SOLN: 30.0000 mg | Freq: Once | INTRAMUSCULAR | Status: DC

## 2022-04-06 MED ORDER — CIPROFLOXACIN HCL 500 MG PO TABS: 500.0000 mg | ORAL_TABLET | Freq: Two times a day (BID) | ORAL | 0 refills | Status: AC

## 2022-04-06 MED ORDER — ONDANSETRON 4 MG PO TBDP: 4.0000 mg | ORAL_TABLET | Freq: Once | ORAL | Status: DC

## 2022-04-06 NOTE — ED Triage Notes (Signed)
Pt states she woke up with right side flank pain this morning. Pt reports diarrhea stools this morning.

## 2022-04-06 NOTE — Discharge Instructions (Signed)
Your urine was positive for infection.  We are sending off for culture to ensure we are treating it with the appropriate antibiotic, you may call if we need to adjust your antibiotic therapy.  Please take the Zofran every 8 hours as needed for nausea or vomiting.  Please ensure you are drinking at least 64 ounces of water daily.  Soda and juice can be urinary irritants.  Please return to clinic if no improvement in the next 24 to 48 hours, you develop fever, uncontrollable vomiting, or worsening of symptoms.

## 2022-04-06 NOTE — ED Provider Notes (Signed)
[facility]    CSN: [id number] Arrival date & time: 04/06/22  1314      History   Chief Complaint Chief Complaint  Patient presents with   Abdominal Pain   Emesis   Diarrhea    HPI Caroline Leon is a 32 y.o. female.   Patient presents to clinic for abdominal pain that started yesterday.  Initially she started with this.  Cramping however, she has yet to start her period this month.  This morning she tried to eat something for breakfast and vomited it back up.  She has had about 5 episodes of emesis that consisted of white/yellow bile and food.  She denies any diarrhea, and normal bowel movement this morning.  Yesterday she she did try the new Parmesan crusted chicken burger at [employer] bar.  She denies flank pain, denies dysuria, denies vaginal discharge, fever, shortness of breath or chest pain.  She has not tried anything for her nausea or symptoms.   The history is provided by the patient and medical records.  Abdominal Pain Associated symptoms: nausea and vomiting   Associated symptoms: no chest pain, no chills, no constipation, no cough, no diarrhea, no dysuria, no fever, no hematuria, no shortness of breath and no sore throat   Emesis Associated symptoms: abdominal pain   Associated symptoms: no arthralgias, no chills, no cough, no diarrhea, no fever and no sore throat   Diarrhea Associated symptoms: abdominal pain and vomiting   Associated symptoms: no arthralgias, no chills and no fever     History reviewed. No pertinent past medical history.  There are no problems to display for this patient.   Past Surgical History:  Procedure Laterality Date   MOUTH SURGERY      OB History   No obstetric history on file.      Home Medications    Prior to Admission medications   Medication Sig Start Date End Date Taking? Authorizing Provider  ciprofloxacin (CIPRO) 500 MG tablet Take 1 tablet (500 mg total) by mouth every 12 (twelve) hours for  7 days. 04/06/22 04/13/22 Yes Louretta Shorten, [place] N, FNP  ondansetron (ZOFRAN-ODT) 8 MG disintegrating tablet Take 1 tablet (8 mg total) by mouth every 8 (eight) hours as needed for nausea or vomiting. 04/06/22  Yes Louretta Shorten, [place] N, FNP  meloxicam (MOBIC) 7.5 MG tablet Take 1 tablet (7.5 mg total) by mouth daily. 02/10/22   Sherrye Payor A, PA-C  methocarbamol (ROBAXIN) 500 MG tablet Take 1 tablet (500 mg total) by mouth every 6 (six) hours as needed for muscle spasms. 02/10/22   Gwenevere Abbot, PA-C    Family History Family History  Problem Relation Age of Onset   Healthy Mother    Healthy Father     Social History Social History   Tobacco Use   Smoking status: Every Day    Packs/day: 1    Types: Cigarettes   Smokeless tobacco: Never  Vaping Use   Vaping Use: Never used  Substance Use Topics   Alcohol use: Yes    Comment: occasionally   Drug use: Yes    Types: Marijuana     Allergies   Tomato and Penicillins   Review of Systems Review of Systems  Constitutional:  Negative for chills and fever.  HENT:  Negative for ear pain and sore throat.   Eyes:  Negative for pain and visual disturbance.  Respiratory:  Negative for cough and shortness of breath.   Cardiovascular:  Negative for chest  pain and palpitations.  Gastrointestinal:  Positive for abdominal pain, nausea and vomiting. Negative for constipation and diarrhea.  Genitourinary:  Negative for dysuria, flank pain and hematuria.  Musculoskeletal:  Negative for arthralgias and back pain.  Skin:  Negative for color change and rash.  Neurological:  Negative for seizures and syncope.  All other systems reviewed and are negative.    Physical Exam Triage Vital Signs ED Triage Vitals [04/06/22 1527]  Enc Vitals Group     BP 112/76     Pulse Rate 83     Resp 16     Temp 98.3 F (36.8 C)     Temp Source Oral     SpO2 98 %     Weight      Height      Head Circumference      Peak Flow      Pain Score      Pain  Loc      Pain Edu?      Excl. in [place]?    No data found.  Updated Vital Signs BP 112/76 (BP Location: Left Arm)   Pulse 83   Temp 98.3 F (36.8 C) (Oral)   Resp 16   SpO2 98%   Visual Acuity Right Eye Distance:   Left Eye Distance:   Bilateral Distance:    Right Eye Near:   Left Eye Near:    Bilateral Near:     Physical Exam Vitals and nursing note reviewed.  Constitutional:      General: She is not in acute distress.    Appearance: She is well-developed.  HENT:     Head: Normocephalic and atraumatic.     Mouth/Throat:     Mouth: Mucous membranes are moist.  Eyes:     Conjunctiva/sclera: Conjunctivae normal.  Cardiovascular:     Rate and Rhythm: Normal rate and regular rhythm.     Heart sounds: Normal heart sounds. No murmur heard. Pulmonary:     Effort: Pulmonary effort is normal. No respiratory distress.     Breath sounds: Normal breath sounds.  Abdominal:     General: Abdomen is flat. Bowel sounds are normal.     Palpations: Abdomen is soft.     Tenderness: There is generalized abdominal tenderness. There is right CVA tenderness. There is no left CVA tenderness, guarding or rebound.     Hernia: No hernia is present.  Musculoskeletal:        General: No swelling.     Cervical back: Neck supple.  Skin:    General: Skin is warm and dry.     Capillary Refill: Capillary refill takes less than 2 seconds.  Neurological:     Mental Status: She is alert.  Psychiatric:        Mood and Affect: Mood normal.      UC Treatments / Results  Labs (all labs ordered are listed, but only abnormal results are displayed) Labs Reviewed  POCT URINALYSIS DIPSTICK, ED / UC - Abnormal; Notable for the following components:      Result Value   Nitrite POSITIVE (*)    All other components within normal limits  URINE CULTURE  POC URINE PREG, ED    EKG   Radiology No results found.  Procedures Procedures (including critical care time)  Medications Ordered in  UC Medications  ondansetron (ZOFRAN-ODT) disintegrating tablet 4 mg (has no administration in time range)  ketorolac (TORADOL) 30 MG/ML injection 30 mg (has no administration in time range)  Initial Impression / Assessment and Plan / UC Course  I have reviewed the triage vital signs and the nursing notes.  Pertinent labs & imaging results that were available during my care of the patient were reviewed by me and considered in my medical decision making (see chart for details).  Vitals in triage reviewed, patient is hemodynamically stable.  Acute onset of abdominal pain and emesis.  Active bowel sounds, diffuse generalized tenderness without red flag symptoms of rebound, guarding, hernia or palpated mass.  Patient is afebrile and without tachycardia.  Significant for right-sided CVA tenderness.  Low suspicion for severe dehydration or acute abdomen.  Urinalysis positive for nitrites, will send for culture and treat for acute complicated UTI w/ cipro.  Nausea managed with medication, encourage oral rehydration.  Return precautions and follow-up precautions discussed, patient verbalized understanding.  No questions at this time.      Final Clinical Impressions(s) / UC Diagnoses   Final diagnoses:  Acute pyelonephritis  Nausea and vomiting, unspecified vomiting type     Discharge Instructions      Your urine was positive for infection.  We are sending off for culture to ensure we are treating it with the appropriate antibiotic, you may call if we need to adjust your antibiotic therapy.  Please take the Zofran every 8 hours as needed for nausea or vomiting.  Please ensure you are drinking at least 64 ounces of water daily.  Soda and juice can be urinary irritants.  Please return to clinic if no improvement in the next 24 to 48 hours, you develop fever, uncontrollable vomiting, or worsening of symptoms.      ED Prescriptions     Medication Sig Dispense Auth. Provider   ondansetron  (ZOFRAN-ODT) 8 MG disintegrating tablet Take 1 tablet (8 mg total) by mouth every 8 (eight) hours as needed for nausea or vomiting. 20 tablet Louretta Shorten, [place] N, [place]   ciprofloxacin (CIPRO) 500 MG tablet Take 1 tablet (500 mg total) by mouth every 12 (twelve) hours for 7 days. 14 tablet Nichael Ehly, [place] N, [place]      PDMP not reviewed this encounter.   Asha Grumbine, [place] N, [place] 04/06/22 [phone]

## 2022-04-07 LAB — URINE CULTURE: Culture: NO GROWTH

## 2022-05-02 ENCOUNTER — Encounter (HOSPITAL_COMMUNITY): Payer: Self-pay

## 2022-07-10 ENCOUNTER — Emergency Department (HOSPITAL_COMMUNITY)
Admission: EM | Admit: 2022-07-10 | Discharge: 2022-07-10 | Disposition: A | Discharge status: Home / Self Care | Payer: BLUE CROSS/BLUE SHIELD | Attending: Emergency Medicine | Admitting: Emergency Medicine

## 2022-07-10 ENCOUNTER — Encounter (HOSPITAL_COMMUNITY): Payer: Self-pay

## 2022-07-10 ENCOUNTER — Other Ambulatory Visit: Payer: Self-pay

## 2022-07-10 DIAGNOSIS — R112 Nausea with vomiting, unspecified: Secondary | ICD-10-CM | POA: Insufficient documentation

## 2022-07-10 DIAGNOSIS — T7840XA Allergy, unspecified, initial encounter: Secondary | ICD-10-CM

## 2022-07-10 DIAGNOSIS — J029 Acute pharyngitis, unspecified: Secondary | ICD-10-CM | POA: Insufficient documentation

## 2022-07-10 MED ORDER — FAMOTIDINE IN NACL 20-0.9 MG/50ML-% IV SOLN
20.0000 mg | Freq: Once | INTRAVENOUS | Status: AC
  Administered 2022-07-10: 20 mg via INTRAVENOUS
  Filled 2022-07-10: qty 50

## 2022-07-10 MED ORDER — DIPHENHYDRAMINE HCL 25 MG PO TABS: 25.0000 mg | ORAL_TABLET | Freq: Four times a day (QID) | ORAL | 0 refills | Status: AC

## 2022-07-10 MED ORDER — PREDNISONE 50 MG PO TABS: 50.0000 mg | ORAL_TABLET | Freq: Every day | ORAL | 0 refills | Status: DC

## 2022-07-10 MED ORDER — EPINEPHRINE 0.3 MG/0.3ML IJ SOAJ: 0.3000 mg | INTRAMUSCULAR | 0 refills | Status: AC | PRN

## 2022-07-10 MED ORDER — METHYLPREDNISOLONE SODIUM SUCC 125 MG IJ SOLR
125.0000 mg | Freq: Once | INTRAMUSCULAR | Status: AC
  Administered 2022-07-10: 125 mg via INTRAVENOUS
  Filled 2022-07-10: qty 2

## 2022-07-10 NOTE — ED Triage Notes (Signed)
Per EMS report, pt is allergic to tomatoes and bit into a sandwich that had tomatoes. Was able to spit most of it out. Pt's throat started itching and she started vomiting. Pt was given 50mg  po Benadryl and then 0.3 IM epinephrine from the EMS crew. Pt is still reporting itching and nausea.

## 2022-07-10 NOTE — Discharge Instructions (Signed)
Keep the EpiPen with you at all times.  Take the steroids and the antihistamines as prescribed.
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
## 2022-07-10 NOTE — ED Provider Notes (Signed)
Patient seen by Dr. Anitra Lauth.  Please see her note.  Plan was to monitor after her treatment earlier for any acute anaphylactic reaction patient has remained symptom-free.  She is ready for discharge.   Linwood Dibbles, MD 07/10/22 [phone]

## 2023-03-08 ENCOUNTER — Ambulatory Visit (HOSPITAL_COMMUNITY)
Admission: EM | Admit: 2023-03-08 | Discharge: 2023-03-08 | Disposition: A | Discharge status: Home / Self Care | Payer: Self-pay | Attending: Emergency Medicine | Admitting: Emergency Medicine

## 2023-03-08 ENCOUNTER — Encounter (HOSPITAL_COMMUNITY): Payer: Self-pay

## 2023-03-08 DIAGNOSIS — A059 Bacterial foodborne intoxication, unspecified: Secondary | ICD-10-CM

## 2023-03-08 DIAGNOSIS — K529 Noninfective gastroenteritis and colitis, unspecified: Secondary | ICD-10-CM

## 2023-03-08 LAB — POCT URINE PREGNANCY: Preg Test, Ur: NEGATIVE

## 2023-03-08 MED ORDER — ONDANSETRON 4 MG PO TBDP
4.0000 mg | ORAL_TABLET | Freq: Once | ORAL | Status: AC
  Administered 2023-03-08: 4 mg via ORAL

## 2023-03-08 MED ORDER — ONDANSETRON 4 MG PO TBDP
ORAL_TABLET | ORAL | Status: AC
  Filled 2023-03-08: qty 1

## 2023-03-08 MED ORDER — ONDANSETRON 4 MG PO TBDP: 4.0000 mg | ORAL_TABLET | Freq: Four times a day (QID) | ORAL | 0 refills | Status: AC | PRN

## 2023-03-08 NOTE — Discharge Instructions (Addendum)
 The zofran can be used every 6 hours as needed to settle the stomach Drink LOTS of fluids - specifically water Bland diet if tolerated (saltines, toast)

## 2023-03-08 NOTE — ED Provider Notes (Signed)
 [facility]    CSN: [id number] Arrival date & time: 03/08/23  1131     History   Chief Complaint Chief Complaint  Patient presents with   Emesis    HPI Caroline Leon is a 33 y.o. female.  2-day history of nausea with vomiting and diarrhea Started about 10-15 minutes after eating tacos Reporting generalized abdominal discomfort Last emesis this morning. Had a red bull after that stayed down No fevers Has been using Pepto  LMP 03/04/2023  History reviewed. No pertinent past medical history.  There are no active problems to display for this patient.   Past Surgical History:  Procedure Laterality Date   MOUTH SURGERY      OB History   No obstetric history on file.      Home Medications    Prior to Admission medications   Medication Sig Start Date End Date Taking? Authorizing Provider  ondansetron (ZOFRAN-ODT) 4 MG disintegrating tablet Take 1 tablet (4 mg total) by mouth every 6 (six) hours as needed for nausea or vomiting. 03/08/23  Yes Hasnain Manheim, Lurena Joiner, PA-C  diphenhydrAMINE (BENADRYL) 25 MG tablet Take 1 tablet (25 mg total) by mouth every 6 (six) hours for 5 days. 07/10/22 07/15/22  Linwood Dibbles, MD  EPINEPHrine 0.3 mg/0.3 mL IJ SOAJ injection Inject 0.3 mg into the muscle as needed for anaphylaxis. 07/10/22   Linwood Dibbles, MD    Family History Family History  Problem Relation Age of Onset   Healthy Mother    Healthy Father     Social History Social History   Tobacco Use   Smoking status: Every Day    Current packs/day: 1.00    Types: Cigarettes   Smokeless tobacco: Never  Vaping Use   Vaping status: Never Used  Substance Use Topics   Alcohol use: Yes    Comment: occasionally   Drug use: Yes    Types: Marijuana     Allergies   Tomato and Penicillins   Review of Systems Review of Systems Per HPI  Physical Exam Triage Vital Signs ED Triage Vitals  Encounter Vitals Group     BP 03/08/23 1140 109/70     Systolic BP Percentile --       Diastolic BP Percentile --      Pulse Rate 03/08/23 1140 88     Resp 03/08/23 1140 16     Temp 03/08/23 1140 98.7 F (37.1 C)     Temp Source 03/08/23 1140 Oral     SpO2 03/08/23 1140 96 %     Weight 03/08/23 1139 130 lb (59 kg)     Height 03/08/23 1139 5\' 6"  (1.676 m)     Head Circumference --      Peak Flow --      Pain Score 03/08/23 1139 8     Pain Loc --      Pain Education --      Exclude from Growth Chart --    No data found.  Updated Vital Signs BP 109/70 (BP Location: Left Arm)   Pulse 88   Temp 98.7 F (37.1 C) (Oral)   Resp 16   Ht 5\' 6"  (1.676 m)   Wt 130 lb (59 kg)   LMP 03/04/2023 (Approximate)   SpO2 96%   BMI 20.98 kg/m     Physical Exam Vitals and nursing note reviewed.  Constitutional:      General: She is not in acute distress.    Appearance: Normal appearance. She is not ill-appearing.  HENT:     Mouth/Throat:     Mouth: Mucous membranes are moist.     Pharynx: Oropharynx is clear.  Eyes:     Conjunctiva/sclera: Conjunctivae normal.  Cardiovascular:     Rate and Rhythm: Normal rate and regular rhythm.     Heart sounds: Normal heart sounds.  Pulmonary:     Effort: Pulmonary effort is normal. No respiratory distress.     Breath sounds: Normal breath sounds.  Abdominal:     Palpations: Abdomen is soft.     Tenderness: There is generalized abdominal tenderness. There is no right CVA tenderness, left CVA tenderness, guarding or rebound.  Musculoskeletal:        General: Normal range of motion.  Skin:    General: Skin is warm and dry.  Neurological:     Mental Status: She is alert and oriented to person, place, and time.     UC Treatments / Results  Labs (all labs ordered are listed, but only abnormal results are displayed) Labs Reviewed  POCT URINE PREGNANCY    EKG  Radiology No results found.  Procedures Procedures (including critical care time)  Medications Ordered in UC Medications  ondansetron (ZOFRAN-ODT)  disintegrating tablet 4 mg (4 mg Oral Given 03/08/23 1151)    Initial Impression / Assessment and Plan / UC Course  I have reviewed the triage vital signs and the nursing notes.  Pertinent labs & imaging results that were available during my care of the patient were reviewed by me and considered in my medical decision making (see chart for details).   UPT negative Zofran ODT given with improvement in nausea  PO challenge successful Likely food-bourne illness. Zofran q6 hours prn, increased fluids, bland diet if tolerated. Return and ED precautions discussed. Patient agrees to plan. Work note provided   Final Clinical Impressions(s) / UC Diagnoses   Final diagnoses:  Food poisoning  Gastroenteritis     Discharge Instructions      The zofran can be used every 6 hours as needed to settle the stomach Drink LOTS of fluids - specifically water Bland diet if tolerated (saltines, toast)     ED Prescriptions     Medication Sig Dispense Auth. Provider   ondansetron (ZOFRAN-ODT) 4 MG disintegrating tablet Take 1 tablet (4 mg total) by mouth every 6 (six) hours as needed for nausea or vomiting. 20 tablet Canon Gola, Lurena Joiner, PA-C      PDMP not reviewed this encounter.   Mirian Casco, Lurena Joiner, [place] 03/08/23 1222

## 2023-03-08 NOTE — ED Triage Notes (Signed)
 Patient here today with c/o nausea, vomiting, abd pain, and diarrhea since Thursday. She has been taking Pepto Bismol with no relief.

## 2023-09-20 ENCOUNTER — Other Ambulatory Visit: Payer: Self-pay

## 2023-09-20 ENCOUNTER — Encounter (HOSPITAL_COMMUNITY): Payer: Self-pay | Admitting: *Deleted

## 2023-09-20 ENCOUNTER — Emergency Department (HOSPITAL_COMMUNITY)
Admission: EM | Admit: 2023-09-20 | Discharge: 2023-09-20 | Disposition: A | Discharge status: Home / Self Care | Payer: Self-pay | Attending: Emergency Medicine | Admitting: Emergency Medicine

## 2023-09-20 DIAGNOSIS — W2209XA Striking against other stationary object, initial encounter: Secondary | ICD-10-CM | POA: Insufficient documentation

## 2023-09-20 DIAGNOSIS — S0001XA Abrasion of scalp, initial encounter: Secondary | ICD-10-CM | POA: Insufficient documentation

## 2023-09-20 DIAGNOSIS — S0990XA Unspecified injury of head, initial encounter: Secondary | ICD-10-CM

## 2023-09-20 DIAGNOSIS — Y990 Civilian activity done for income or pay: Secondary | ICD-10-CM | POA: Insufficient documentation

## 2023-09-20 NOTE — ED Provider Notes (Signed)
 [facility] Provider Note   CSN: [id number] Arrival date & time: 09/20/23  1612    Patient presents with: Head Injury   Caroline Leon is a 33 y.o. female here for evaluation of head injury.  Works at [employer].  Stood up and struck the superior aspect of head.  A small abrasion.  Tetanus is up-to-date.  Initially had a headache and felt woozy.  Symptoms improved after some ibuprofen .  No vision changes, numbness, weakness, vomiting, LOC, anticoagulation.  Was sent here for [facility] evaluation.   HPI     Prior to Admission medications   Medication Sig Start Date End Date Taking? Authorizing Provider  diphenhydrAMINE  (BENADRYL ) 25 MG tablet Take 1 tablet (25 mg total) by mouth every 6 (six) hours for 5 days. 07/10/22 07/15/22  Randol Simmonds, MD  EPINEPHrine  0.3 mg/0.3 mL IJ SOAJ injection Inject 0.3 mg into the muscle as needed for anaphylaxis. 07/10/22   Randol Simmonds, MD  ondansetron  (ZOFRAN -ODT) 4 MG disintegrating tablet Take 1 tablet (4 mg total) by mouth every 6 (six) hours as needed for nausea or vomiting. 03/08/23   Rising, [place], PA-C    Allergies: Tomato and Penicillins    Review of Systems  Constitutional: Negative.   HENT: Negative.    Respiratory: Negative.    Cardiovascular: Negative.   Gastrointestinal: Negative.   Genitourinary: Negative.   Musculoskeletal: Negative.   Skin:  Positive for wound.  Neurological:  Positive for headaches. Negative for dizziness, tremors, seizures, syncope, facial asymmetry, speech difficulty, weakness, light-headedness and numbness.  All other systems reviewed and are negative.  Updated Vital Signs BP (!) 137/92 (BP Location: Right Arm)   Pulse 87   Temp 99.4 F (37.4 C)   Resp 14   Ht 5' 6 (1.676 m)   Wt 59 kg   LMP 09/15/2023   SpO2 99%   BMI 20.99 kg/m   Physical Exam Physical Exam  Constitutional: Pt is oriented to person, place, and time. Pt appears well-developed and  well-nourished. No distress.  HENT:  Head: Normocephalic and atraumatic.  Mouth/Throat: Oropharynx is clear and moist.  Eyes: Conjunctivae and EOM are normal. Pupils are equal, round, and reactive to light. No scleral icterus.  No horizontal, vertical or rotational nystagmus  Neck: Normal range of motion. Neck supple.  Full active and passive ROM without pain No midline or paraspinal tenderness No nuchal rigidity or meningeal signs  Cardiovascular: Normal rate, regular rhythm and intact distal pulses.   Pulmonary/Chest: Effort normal and breath sounds normal. No respiratory distress. Pt has no wheezes. No rales.  Abdominal: Soft. Bowel sounds are normal. There is no tenderness. There is no rebound and no guarding.  Musculoskeletal: Normal range of motion.  Lymphadenopathy:    No cervical adenopathy.  Neurological: Pt. is alert and oriented to person, place, and time. He has normal reflexes. No cranial nerve deficit.  Exhibits normal muscle tone. Coordination normal.  Mental Status:  Alert, oriented, thought content appropriate. Speech fluent without evidence of aphasia. Able to follow 2 step commands without difficulty.  Cranial Nerves:  2-12 grossly intact Motor:  Equal strength BIL Sensory: Intact Cerebellar: neg F2N Gait: normal gait and balance CV: distal pulses palpable throughout   Skin: Skin is warm and dry. Pt is not diaphoretic. 1 cm abrasion to central parietal scalp Psychiatric: Pt has a normal mood and affect. Behavior is normal. Judgment and thought content normal.  Nursing note and vitals reviewed.  (all labs  ordered are listed, but only abnormal results are displayed) Labs Reviewed - No data to display  EKG: None  Radiology: No results found.   Procedures   Medications Ordered in the ED - No data to display  33 year old here for evaluation of head injury.  Was at work today stood up and hit the superior aspect of her head on cabinet.  She has 1 cm abrasion  to parietal scalp.  No lacerations requiring suturing.  Initially had headache and felt dazed.  Employer wanted her to come here to rule out concussion.  Here she has a nonfocal neuroexam.  Her tetanus is up-to-date.  Abrasion does not need closure.  I discussed with patient that concussion is a diagnosis of exclusion and a clinical diagnosis that there is not any specific testing to r/o concussion. I had shared decision making with patient w/ imaging to r/o traumatic injuries on CT.  Pt deferred imaging at this time.  Discussed strict return precautions as well as wound care for her abrasion.  Will have her follow-up outpatient, return for any worsening symptoms.  Low suspicion for intracranial bleed, infection, dissection, fracture.  The patient has been appropriately medically screened and/or stabilized in the ED. I have low suspicion for any other emergent medical condition which would require further screening, evaluation or treatment in the ED or require inpatient management.  Patient is hemodynamically stable and in no acute distress.  Patient able to ambulate in department prior to ED.  Evaluation does not show acute pathology that would require ongoing or additional emergent interventions while in the emergency department or further inpatient treatment.  I have discussed the diagnosis with the patient and answered all questions.  Pain is been managed while in the emergency department and patient has no further complaints prior to discharge.  Patient is comfortable with plan discussed in room and is stable for discharge at this time.  I have discussed strict return precautions for returning to the emergency department.  Patient was encouraged to follow-up with PCP/specialist refer to at discharge.                                    Medical Decision Making Amount and/or Complexity of Data Reviewed External Data Reviewed: labs, radiology and notes.  Risk OTC drugs. Diagnosis or treatment  significantly limited by social determinants of health.       Final diagnoses:  Injury of head, initial encounter  Abrasion of scalp, initial encounter    ED Discharge Orders     None          Lyndall Bellot A, PA-C 09/20/23 1715    Elnor Bernarda SQUIBB, DO 09/29/23 [phone]

## 2023-09-20 NOTE — Discharge Instructions (Signed)
 It was a pleasure taking care of you here today  As we discussed you have a soft tissue injury to the top of your head.   Make sure to keep a close eye on your symptoms.  If you develop severe worsening headache, vision changes, numbness, weakness, vomiting please return for new or worsening symptoms.

## 2023-09-20 NOTE — ED Triage Notes (Signed)
 The pt works at [employer]  and  she was struck  in the top of her head from the refrigerator door no loc  small cut to the top of her head  lmp 4 days ago
